# Patient Record
Sex: Female | Born: 1985 | Race: White | Hispanic: No | Marital: Married | Smoking: Never smoker
Health system: Southern US, Community
[De-identification: ages and names within clinical notes are randomized; demographics above are authoritative.]

## PROBLEM LIST (undated history)

## (undated) DIAGNOSIS — O139 Gestational [pregnancy-induced] hypertension without significant proteinuria, unspecified trimester: Secondary | ICD-10-CM

## (undated) DIAGNOSIS — D649 Anemia, unspecified: Secondary | ICD-10-CM

## (undated) DIAGNOSIS — K219 Gastro-esophageal reflux disease without esophagitis: Secondary | ICD-10-CM

## (undated) DIAGNOSIS — F32A Depression, unspecified: Secondary | ICD-10-CM

## (undated) DIAGNOSIS — F419 Anxiety disorder, unspecified: Secondary | ICD-10-CM

## (undated) DIAGNOSIS — F988 Other specified behavioral and emotional disorders with onset usually occurring in childhood and adolescence: Secondary | ICD-10-CM

## (undated) HISTORY — PX: APPENDECTOMY: SHX54

## (undated) HISTORY — DX: Gastro-esophageal reflux disease without esophagitis: K21.9

## (undated) HISTORY — DX: Anemia, unspecified: D64.9

---
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
## 2015-03-07 DIAGNOSIS — F900 Attention-deficit hyperactivity disorder, predominantly inattentive type: Secondary | ICD-10-CM | POA: Insufficient documentation

## 2020-10-19 ENCOUNTER — Ambulatory Visit (HOSPITAL_COMMUNITY)
Admission: EM | Admit: 2020-10-19 | Discharge: 2020-10-19 | Disposition: A | Discharge status: Home / Self Care | Payer: No Typology Code available for payment source | Attending: Medical Oncology | Admitting: Medical Oncology

## 2020-10-19 ENCOUNTER — Encounter (HOSPITAL_COMMUNITY): Payer: Self-pay

## 2020-10-19 ENCOUNTER — Emergency Department (HOSPITAL_COMMUNITY): Payer: No Typology Code available for payment source | Admitting: Anesthesiology

## 2020-10-19 ENCOUNTER — Ambulatory Visit (HOSPITAL_COMMUNITY)
Admission: EM | Admit: 2020-10-19 | Discharge: 2020-10-19 | Disposition: A | Discharge status: Home / Self Care | Payer: No Typology Code available for payment source | Attending: Emergency Medicine | Admitting: Emergency Medicine

## 2020-10-19 ENCOUNTER — Emergency Department (HOSPITAL_COMMUNITY): Payer: No Typology Code available for payment source

## 2020-10-19 ENCOUNTER — Encounter (HOSPITAL_COMMUNITY)
Admission: EM | Disposition: A | Discharge status: Home / Self Care | Payer: Self-pay | Source: Home / Self Care | Attending: Emergency Medicine

## 2020-10-19 ENCOUNTER — Other Ambulatory Visit: Payer: Self-pay

## 2020-10-19 ENCOUNTER — Encounter (HOSPITAL_COMMUNITY): Payer: Self-pay | Admitting: Emergency Medicine

## 2020-10-19 DIAGNOSIS — R1031 Right lower quadrant pain: Secondary | ICD-10-CM

## 2020-10-19 DIAGNOSIS — K3580 Unspecified acute appendicitis: Secondary | ICD-10-CM | POA: Diagnosis not present

## 2020-10-19 DIAGNOSIS — F32A Depression, unspecified: Secondary | ICD-10-CM | POA: Diagnosis not present

## 2020-10-19 DIAGNOSIS — Z79899 Other long term (current) drug therapy: Secondary | ICD-10-CM | POA: Diagnosis not present

## 2020-10-19 DIAGNOSIS — Z8759 Personal history of other complications of pregnancy, childbirth and the puerperium: Secondary | ICD-10-CM | POA: Diagnosis not present

## 2020-10-19 DIAGNOSIS — Z20822 Contact with and (suspected) exposure to covid-19: Secondary | ICD-10-CM | POA: Insufficient documentation

## 2020-10-19 DIAGNOSIS — F419 Anxiety disorder, unspecified: Secondary | ICD-10-CM | POA: Insufficient documentation

## 2020-10-19 DIAGNOSIS — F988 Other specified behavioral and emotional disorders with onset usually occurring in childhood and adolescence: Secondary | ICD-10-CM | POA: Diagnosis not present

## 2020-10-19 DIAGNOSIS — R110 Nausea: Secondary | ICD-10-CM | POA: Diagnosis present

## 2020-10-19 HISTORY — DX: Depression, unspecified: F32.A

## 2020-10-19 HISTORY — PX: LAPAROSCOPIC APPENDECTOMY: SHX408

## 2020-10-19 HISTORY — DX: Other specified behavioral and emotional disorders with onset usually occurring in childhood and adolescence: F98.8

## 2020-10-19 HISTORY — DX: Anxiety disorder, unspecified: F41.9

## 2020-10-19 HISTORY — DX: Gestational (pregnancy-induced) hypertension without significant proteinuria, unspecified trimester: O13.9

## 2020-10-19 LAB — CBC
HCT: 40.1 % (ref 36.0–46.0)
Hemoglobin: 13 g/dL (ref 12.0–15.0)
MCH: 30.3 pg (ref 26.0–34.0)
MCHC: 32.4 g/dL (ref 30.0–36.0)
MCV: 93.5 fL (ref 80.0–100.0)
Platelets: 368 10*3/uL (ref 150–400)
RBC: 4.29 MIL/uL (ref 3.87–5.11)
RDW: 12.6 % (ref 11.5–15.5)
WBC: 15.8 10*3/uL — ABNORMAL HIGH (ref 4.0–10.5)
nRBC: 0 % (ref 0.0–0.2)

## 2020-10-19 LAB — URINALYSIS, ROUTINE W REFLEX MICROSCOPIC
Bacteria, UA: NONE SEEN
Bilirubin Urine: NEGATIVE
Glucose, UA: NEGATIVE mg/dL
Hgb urine dipstick: NEGATIVE
Ketones, ur: NEGATIVE mg/dL
Leukocytes,Ua: NEGATIVE
Nitrite: NEGATIVE
Protein, ur: 30 mg/dL — AB
Specific Gravity, Urine: 1.028 (ref 1.005–1.030)
pH: 7 (ref 5.0–8.0)

## 2020-10-19 LAB — COMPREHENSIVE METABOLIC PANEL
ALT: 26 U/L (ref 0–44)
AST: 24 U/L (ref 15–41)
Albumin: 4.4 g/dL (ref 3.5–5.0)
Alkaline Phosphatase: 91 U/L (ref 38–126)
Anion gap: 12 (ref 5–15)
BUN: 11 mg/dL (ref 6–20)
CO2: 22 mmol/L (ref 22–32)
Calcium: 9.7 mg/dL (ref 8.9–10.3)
Chloride: 103 mmol/L (ref 98–111)
Creatinine, Ser: 0.64 mg/dL (ref 0.44–1.00)
GFR, Estimated: >60 mL/min (ref >60)
Glucose, Bld: 116 mg/dL — ABNORMAL HIGH (ref 70–99)
Potassium: 3.9 mmol/L (ref 3.5–5.1)
Sodium: 137 mmol/L (ref 135–145)
Total Bilirubin: 0.8 mg/dL (ref 0.3–1.2)
Total Protein: 7.9 g/dL (ref 6.5–8.1)

## 2020-10-19 LAB — POC URINE PREG, ED: Preg Test, Ur: NEGATIVE

## 2020-10-19 LAB — POCT URINALYSIS DIPSTICK, ED / UC
Bilirubin Urine: NEGATIVE
Glucose, UA: NEGATIVE mg/dL
Hgb urine dipstick: NEGATIVE
Ketones, ur: NEGATIVE mg/dL
Leukocytes,Ua: NEGATIVE
Nitrite: NEGATIVE
Protein, ur: 30 mg/dL — AB
Specific Gravity, Urine: 1.02 (ref 1.005–1.030)
Urobilinogen, UA: 0.2 mg/dL (ref 0.0–1.0)
pH: 7.5 (ref 5.0–8.0)

## 2020-10-19 LAB — I-STAT BETA HCG BLOOD, ED (MC, WL, AP ONLY): I-stat hCG, quantitative: <5 m[IU]/mL (ref <5)

## 2020-10-19 LAB — SARS CORONAVIRUS 2 BY RT PCR (HOSPITAL ORDER, PERFORMED IN ~~LOC~~ HOSPITAL LAB): SARS Coronavirus 2: NEGATIVE

## 2020-10-19 LAB — LIPASE, BLOOD: Lipase: 23 U/L (ref 11–51)

## 2020-10-19 SURGERY — APPENDECTOMY, LAPAROSCOPIC
Anesthesia: General

## 2020-10-19 MED ORDER — PHENYLEPHRINE 40 MCG/ML (10ML) SYRINGE FOR IV PUSH (FOR BLOOD PRESSURE SUPPORT)
PREFILLED_SYRINGE | INTRAVENOUS | Status: AC
  Filled 2020-10-19: qty 10

## 2020-10-19 MED ORDER — ONDANSETRON 4 MG PO TBDP
4.0000 mg | ORAL_TABLET | Freq: Once | ORAL | Status: AC
  Administered 2020-10-19: 4 mg via ORAL

## 2020-10-19 MED ORDER — DEXMEDETOMIDINE (PRECEDEX) IN NS 20 MCG/5ML (4 MCG/ML) IV SYRINGE
PREFILLED_SYRINGE | INTRAVENOUS | Status: DC | PRN
  Administered 2020-10-19: 4 ug via INTRAVENOUS
  Administered 2020-10-19 (×2): 8 ug via INTRAVENOUS

## 2020-10-19 MED ORDER — CHLORHEXIDINE GLUCONATE 0.12 % MT SOLN
15.0000 mL | OROMUCOSAL | Status: DC
  Filled 2020-10-19: qty 15

## 2020-10-19 MED ORDER — 0.9 % SODIUM CHLORIDE (POUR BTL) OPTIME
TOPICAL | Status: DC | PRN
  Administered 2020-10-19: 1000 mL

## 2020-10-19 MED ORDER — METRONIDAZOLE IN NACL 5-0.79 MG/ML-% IV SOLN
500.0000 mg | Freq: Once | INTRAVENOUS | Status: AC
  Administered 2020-10-19: 500 mg via INTRAVENOUS
  Filled 2020-10-19: qty 100

## 2020-10-19 MED ORDER — MIDAZOLAM HCL 2 MG/2ML IJ SOLN
INTRAMUSCULAR | Status: AC
  Filled 2020-10-19: qty 2

## 2020-10-19 MED ORDER — DEXAMETHASONE SODIUM PHOSPHATE 10 MG/ML IJ SOLN
INTRAMUSCULAR | Status: DC | PRN
  Administered 2020-10-19: 10 mg via INTRAVENOUS

## 2020-10-19 MED ORDER — DEXAMETHASONE SODIUM PHOSPHATE 10 MG/ML IJ SOLN
INTRAMUSCULAR | Status: AC
  Filled 2020-10-19: qty 1

## 2020-10-19 MED ORDER — SUGAMMADEX SODIUM 200 MG/2ML IV SOLN
INTRAVENOUS | Status: DC | PRN
  Administered 2020-10-19: 50 mg via INTRAVENOUS
  Administered 2020-10-19: 150 mg via INTRAVENOUS

## 2020-10-19 MED ORDER — SODIUM CHLORIDE 0.9 % IV BOLUS
1000.0000 mL | Freq: Once | INTRAVENOUS | Status: AC
Start: 2020-10-19 — End: 2020-10-19
  Administered 2020-10-19: 1000 mL via INTRAVENOUS

## 2020-10-19 MED ORDER — ONDANSETRON HCL 4 MG/2ML IJ SOLN
INTRAMUSCULAR | Status: AC
  Filled 2020-10-19: qty 2

## 2020-10-19 MED ORDER — ONDANSETRON 4 MG PO TBDP
ORAL_TABLET | ORAL | Status: AC
  Filled 2020-10-19: qty 1

## 2020-10-19 MED ORDER — SODIUM CHLORIDE 0.9 % IV SOLN
2.0000 g | Freq: Once | INTRAVENOUS | Status: AC
  Administered 2020-10-19: 2 g via INTRAVENOUS
  Filled 2020-10-19: qty 20

## 2020-10-19 MED ORDER — OXYCODONE HCL 5 MG PO TABS: 5.0000 mg | ORAL_TABLET | Freq: Once | ORAL | Status: DC | PRN

## 2020-10-19 MED ORDER — SODIUM CHLORIDE 0.9 % IR SOLN
Status: DC | PRN
  Administered 2020-10-19: 1000 mL

## 2020-10-19 MED ORDER — LIDOCAINE 2% (20 MG/ML) 5 ML SYRINGE
INTRAMUSCULAR | Status: AC
  Filled 2020-10-19: qty 5

## 2020-10-19 MED ORDER — BUPIVACAINE HCL (PF) 0.25 % IJ SOLN
INTRAMUSCULAR | Status: AC
  Filled 2020-10-19: qty 30

## 2020-10-19 MED ORDER — ACETAMINOPHEN 500 MG PO TABS: 1000.0000 mg | ORAL_TABLET | ORAL | Status: DC

## 2020-10-19 MED ORDER — HYDROMORPHONE HCL 1 MG/ML IJ SOLN
0.2500 mg | INTRAMUSCULAR | Status: DC | PRN
  Administered 2020-10-19: 0.5 mg via INTRAVENOUS
  Administered 2020-10-19: 0.25 mg via INTRAVENOUS

## 2020-10-19 MED ORDER — EPHEDRINE 5 MG/ML INJ
INTRAVENOUS | Status: AC
  Filled 2020-10-19: qty 10

## 2020-10-19 MED ORDER — FENTANYL CITRATE (PF) 250 MCG/5ML IJ SOLN
INTRAMUSCULAR | Status: AC
  Filled 2020-10-19: qty 5

## 2020-10-19 MED ORDER — OXYCODONE HCL 5 MG/5ML PO SOLN: 5.0000 mg | Freq: Once | ORAL | Status: DC | PRN

## 2020-10-19 MED ORDER — LIDOCAINE 2% (20 MG/ML) 5 ML SYRINGE
INTRAMUSCULAR | Status: DC | PRN
  Administered 2020-10-19: 40 mg via INTRAVENOUS

## 2020-10-19 MED ORDER — SUCCINYLCHOLINE CHLORIDE 200 MG/10ML IV SOSY
PREFILLED_SYRINGE | INTRAVENOUS | Status: DC | PRN
  Administered 2020-10-19: 140 mg via INTRAVENOUS

## 2020-10-19 MED ORDER — IOHEXOL 300 MG/ML  SOLN
100.0000 mL | Freq: Once | INTRAMUSCULAR | Status: AC | PRN
  Administered 2020-10-19: 100 mL via INTRAVENOUS

## 2020-10-19 MED ORDER — PROPOFOL 10 MG/ML IV BOLUS
INTRAVENOUS | Status: DC | PRN
  Administered 2020-10-19: 170 mg via INTRAVENOUS

## 2020-10-19 MED ORDER — MIDAZOLAM HCL 2 MG/2ML IJ SOLN
INTRAMUSCULAR | Status: DC | PRN
  Administered 2020-10-19: 2 mg via INTRAVENOUS

## 2020-10-19 MED ORDER — FENTANYL CITRATE (PF) 250 MCG/5ML IJ SOLN
INTRAMUSCULAR | Status: DC | PRN
  Administered 2020-10-19 (×2): 50 ug via INTRAVENOUS

## 2020-10-19 MED ORDER — ONDANSETRON HCL 4 MG/2ML IJ SOLN
INTRAMUSCULAR | Status: DC | PRN
  Administered 2020-10-19: 4 mg via INTRAVENOUS

## 2020-10-19 MED ORDER — SUCCINYLCHOLINE CHLORIDE 200 MG/10ML IV SOSY
PREFILLED_SYRINGE | INTRAVENOUS | Status: AC
  Filled 2020-10-19: qty 10

## 2020-10-19 MED ORDER — SODIUM CHLORIDE 0.9 % IV SOLN: Freq: Once | INTRAVENOUS | Status: DC

## 2020-10-19 MED ORDER — GABAPENTIN 300 MG PO CAPS: 300.0000 mg | ORAL_CAPSULE | ORAL | Status: DC

## 2020-10-19 MED ORDER — ROCURONIUM BROMIDE 10 MG/ML (PF) SYRINGE
PREFILLED_SYRINGE | INTRAVENOUS | Status: AC
  Filled 2020-10-19: qty 10

## 2020-10-19 MED ORDER — SCOPOLAMINE 1 MG/3DAYS TD PT72
1.0000 | MEDICATED_PATCH | TRANSDERMAL | Status: DC
  Filled 2020-10-19 (×2): qty 1

## 2020-10-19 MED ORDER — AMISULPRIDE (ANTIEMETIC) 5 MG/2ML IV SOLN: 10.0000 mg | Freq: Once | INTRAVENOUS | Status: DC | PRN

## 2020-10-19 MED ORDER — TRAMADOL HCL 50 MG PO TABS: 50.0000 mg | ORAL_TABLET | Freq: Four times a day (QID) | ORAL | 0 refills | Status: AC | PRN

## 2020-10-19 MED ORDER — PHENYLEPHRINE 40 MCG/ML (10ML) SYRINGE FOR IV PUSH (FOR BLOOD PRESSURE SUPPORT)
PREFILLED_SYRINGE | INTRAVENOUS | Status: DC | PRN
  Administered 2020-10-19: 80 ug via INTRAVENOUS

## 2020-10-19 MED ORDER — KETOROLAC TROMETHAMINE 15 MG/ML IJ SOLN
15.0000 mg | INTRAMUSCULAR | Status: AC
  Administered 2020-10-19: 15 mg via INTRAVENOUS
  Filled 2020-10-19: qty 1

## 2020-10-19 MED ORDER — LACTATED RINGERS IV SOLN: INTRAVENOUS | Status: DC

## 2020-10-19 MED ORDER — ROCURONIUM BROMIDE 10 MG/ML (PF) SYRINGE
PREFILLED_SYRINGE | INTRAVENOUS | Status: DC | PRN
  Administered 2020-10-19: 40 mg via INTRAVENOUS

## 2020-10-19 MED ORDER — MORPHINE SULFATE (PF) 4 MG/ML IV SOLN
4.0000 mg | Freq: Once | INTRAVENOUS | Status: AC
  Administered 2020-10-19: 4 mg via INTRAVENOUS
  Filled 2020-10-19: qty 1

## 2020-10-19 MED ORDER — PROPOFOL 10 MG/ML IV BOLUS
INTRAVENOUS | Status: AC
  Filled 2020-10-19: qty 40

## 2020-10-19 MED ORDER — BUPIVACAINE HCL 0.25 % IJ SOLN
INTRAMUSCULAR | Status: DC | PRN
  Administered 2020-10-19: 14 mL

## 2020-10-19 MED ORDER — HYDROMORPHONE HCL 1 MG/ML IJ SOLN
INTRAMUSCULAR | Status: AC
  Filled 2020-10-19: qty 1

## 2020-10-19 SURGICAL SUPPLY — 51 items
ADH SKN CLS APL DERMABOND .7 (GAUZE/BANDAGES/DRESSINGS) ×1
ADH SKN CLS LQ APL DERMABOND (GAUZE/BANDAGES/DRESSINGS) ×1
APL PRP STRL LF DISP 70% ISPRP (MISCELLANEOUS) ×1
APPLIER CLIP 5 13 M/L LIGAMAX5 (MISCELLANEOUS)
APR CLP MED LRG 5 ANG JAW (MISCELLANEOUS)
BLADE CLIPPER SURG (BLADE) IMPLANT
CANISTER SUCT 3000ML PPV (MISCELLANEOUS) ×2 IMPLANT
CHLORAPREP W/TINT 26 (MISCELLANEOUS) ×2 IMPLANT
CLIP APPLIE 5 13 M/L LIGAMAX5 (MISCELLANEOUS) IMPLANT
CLIP VESOLOCK XL 6/CT (CLIP) ×2 IMPLANT
COVER LIGHT HANDLE  1/PK (MISCELLANEOUS) ×2
COVER LIGHT HANDLE 1/PK (MISCELLANEOUS) ×2 IMPLANT
COVER SURGICAL LIGHT HANDLE (MISCELLANEOUS) ×2 IMPLANT
COVER TRANSDUCER ULTRASND (DRAPES) ×2 IMPLANT
DERMABOND ADHESIVE PROPEN (GAUZE/BANDAGES/DRESSINGS) ×1
DERMABOND ADVANCED (GAUZE/BANDAGES/DRESSINGS) ×1
DERMABOND ADVANCED .7 DNX12 (GAUZE/BANDAGES/DRESSINGS) ×1 IMPLANT
DERMABOND ADVANCED .7 DNX6 (GAUZE/BANDAGES/DRESSINGS) ×1 IMPLANT
ELECT REM PT RETURN 9FT ADLT (ELECTROSURGICAL) ×2
ELECTRODE REM PT RTRN 9FT ADLT (ELECTROSURGICAL) ×1 IMPLANT
ENDOLOOP SUT PDS II  0 18 (SUTURE)
ENDOLOOP SUT PDS II 0 18 (SUTURE) IMPLANT
GLOVE BIO SURGEON STRL SZ7.5 (GLOVE) ×2 IMPLANT
GOWN STRL REUS W/ TWL LRG LVL3 (GOWN DISPOSABLE) ×2 IMPLANT
GOWN STRL REUS W/ TWL XL LVL3 (GOWN DISPOSABLE) ×1 IMPLANT
GOWN STRL REUS W/TWL LRG LVL3 (GOWN DISPOSABLE) ×4
GOWN STRL REUS W/TWL XL LVL3 (GOWN DISPOSABLE) ×2
GRASPER SUT TROCAR 14GX15 (MISCELLANEOUS) ×2 IMPLANT
KIT BASIN OR (CUSTOM PROCEDURE TRAY) ×2 IMPLANT
KIT TURNOVER KIT B (KITS) ×2 IMPLANT
NEEDLE HYPO 25GX1X1/2 BEV (NEEDLE) ×2 IMPLANT
NEEDLE INSUFFLATION 14GA 120MM (NEEDLE) ×2 IMPLANT
NS IRRIG 1000ML POUR BTL (IV SOLUTION) ×2 IMPLANT
PAD ARMBOARD 7.5X6 YLW CONV (MISCELLANEOUS) ×4 IMPLANT
SCISSORS LAP 5X35 DISP (ENDOMECHANICALS) ×2 IMPLANT
SET IRRIG TUBING LAPAROSCOPIC (IRRIGATION / IRRIGATOR) ×2 IMPLANT
SET TUBE SMOKE EVAC HIGH FLOW (TUBING) ×2 IMPLANT
SHEET MEDIUM DRAPE 40X70 STRL (DRAPES) ×2 IMPLANT
SLEEVE ENDOPATH XCEL 5M (ENDOMECHANICALS) ×2 IMPLANT
SPECIMEN JAR SMALL (MISCELLANEOUS) ×2 IMPLANT
SUT MNCRL AB 4-0 PS2 18 (SUTURE) ×2 IMPLANT
SYR CONTROL 10ML LL (SYRINGE) ×2 IMPLANT
TOWEL GREEN STERILE (TOWEL DISPOSABLE) ×2 IMPLANT
TOWEL GREEN STERILE FF (TOWEL DISPOSABLE) ×2 IMPLANT
TRAY FOLEY W/BAG SLVR 16FR (SET/KITS/TRAYS/PACK) ×2
TRAY FOLEY W/BAG SLVR 16FR ST (SET/KITS/TRAYS/PACK) ×1 IMPLANT
TRAY LAPAROSCOPIC MC (CUSTOM PROCEDURE TRAY) ×2 IMPLANT
TROCAR XCEL NON-BLD 11X100MML (ENDOMECHANICALS) ×2 IMPLANT
TROCAR XCEL NON-BLD 5MMX100MML (ENDOMECHANICALS) ×2 IMPLANT
WARMER LAPAROSCOPE (MISCELLANEOUS) ×2 IMPLANT
WATER STERILE IRR 1000ML POUR (IV SOLUTION) ×2 IMPLANT

## 2020-10-19 NOTE — Anesthesia Procedure Notes (Signed)
Procedure Name: Intubation Date/Time: 10/19/2020 7:29 PM Performed by: Darletta Moll, CRNA Pre-anesthesia Checklist: Patient identified, Emergency Drugs available, Suction available and Patient being monitored Patient Re-evaluated:Patient Re-evaluated prior to induction Oxygen Delivery Method: Circle system utilized Preoxygenation: Pre-oxygenation with 100% oxygen Induction Type: IV induction, Rapid sequence and Cricoid Pressure applied Laryngoscope Size: Mac and 3 Grade View: Grade I Tube type: Oral Tube size: 7.0 mm Number of attempts: 1 Airway Equipment and Method: Stylet Placement Confirmation: ETT inserted through vocal cords under direct vision,  positive ETCO2 and breath sounds checked- equal and bilateral Secured at: 21 cm Tube secured with: Tape Dental Injury: Teeth and Oropharynx as per pre-operative assessment

## 2020-10-19 NOTE — ED Notes (Signed)
Patient is being discharged from the Urgent Care and sent to the Emergency Department via [facility] [facility] . Per Provider, patient is in need of higher level of care due to RT lower Quadrant ABD pain and N/V. Patient is aware and verbalizes understanding of plan of care.  Vitals:   10/19/20 0845  BP: (!) 137/92  Pulse: (!) 107  Resp: 16  Temp: 98.5 F (36.9 C)  SpO2: 98%

## 2020-10-19 NOTE — ED Triage Notes (Signed)
Patient c/o ABD pain (RT lower quadrant) x 1 day.   Patient endorses "throwing up every hour while at home".   Patient is "unable to keep food down".   Patient denies fever at home.   Patient denies history of appendicitis.   Patient hasn't taken any medications for symptoms.

## 2020-10-19 NOTE — ED Provider Notes (Signed)
[facility] EMERGENCY DEPARTMENT Provider Note   CSN: [id number] Arrival date & time: 10/19/20  6720     History Chief Complaint  Patient presents with  . Abdominal Pain  . Nausea    Jessica Graham is a 35 y.o. female.  Jessica Graham is a 35 y.o. female with a history of anxiety, depression, ADD, who presents to the ED for evaluation of abdominal pain.  Patient reports that yesterday evening around 7 PM she started having generalized periumbilical abdominal pain, that she describes as a cramping-like sensation.  Around 10:30 PM pain started to worsen and she began having vomiting almost every hour throughout the night, reports that as pain worsened it seemed to settle in the right lower quadrant and since then she has had constant pain in the right lower quadrant that is gotten worse this morning.  Has continued to have nausea but has not been able to eat or drink anything to throw up anymore.  Reports that yesterday she also had some looser stools, denies any melena or hematochezia.  She has had chills but no fevers.  No associated dysuria, urinary frequency, hematuria or flank pain.  No vaginal discharge or vaginal bleeding.  Denies any prior abdominal surgeries.  Reports before she has had some gas pains, but that this pain feels different, initially went to urgent care but was sent here for further evaluation with concern for possible appendicitis.        Past Medical History:  Diagnosis Date  . ADD (attention deficit disorder)   . Anxiety   . Depression   . Gestational hypertension     There are no problems to display for this patient.   Past Surgical History:  Procedure Laterality Date  . TONSILLECTOMY  2008     OB History   No obstetric history on file.     No family history on file.  Social History   Tobacco Use  . Smoking status: Never Smoker  . Smokeless tobacco: Never Used  Substance Use Topics  . Alcohol use: Yes  . Drug use: Never     Home Medications Prior to Admission medications   Medication Sig Start Date End Date Taking? Authorizing Provider  amphetamine-dextroamphetamine (ADDERALL) 20 MG tablet Take 20 mg by mouth 1 day or 1 dose. 09/26/20   [provider]  norethindrone (MICRONOR) 0.35 MG tablet Take 1 tablet by mouth daily. 04/19/20   [provider]  sertraline (ZOLOFT) 100 MG tablet Take 100 mg by mouth 1 day or 1 dose. 08/28/20   [provider]  VYVANSE 60 MG capsule Take 60 mg by mouth every morning. 09/19/20   [provider]    Allergies    Patient has no known allergies.  Review of Systems   Review of Systems  Constitutional: Positive for chills. Negative for fever.  Respiratory: Negative for cough and shortness of breath.   Cardiovascular: Negative for chest pain.  Gastrointestinal: Positive for abdominal pain, diarrhea, nausea and vomiting. Negative for blood in stool.  Genitourinary: Negative for dysuria, flank pain, frequency, hematuria, vaginal bleeding and vaginal discharge.  Musculoskeletal: Negative for arthralgias and myalgias.  Skin: Negative for color change and rash.  Neurological: Negative for dizziness, syncope and light-headedness.  All other systems reviewed and are negative.   Physical Exam Updated Vital Signs BP (!) 136/94 (BP Location: Left Arm)   Pulse (!) 109   Temp 97.9 F (36.6 C) (Oral)   Resp 16   LMP  (  LMP Unknown)   SpO2 97%   Physical Exam Vitals and nursing note reviewed.  Constitutional:      General: She is not in acute distress.    Appearance: She is normal weight and well-nourished. She is not toxic-appearing or diaphoretic.     Comments: Alert, nontoxic-appearing  HENT:     Head: Normocephalic and atraumatic.     Mouth/Throat:     Mouth: Oropharynx is clear and moist.  Eyes:     General:        Right eye: No discharge.        Left eye: No discharge.     Extraocular Movements: EOM normal.     Pupils: Pupils are  equal, round, and reactive to light.  Cardiovascular:     Rate and Rhythm: Regular rhythm. Tachycardia present.     Pulses: Intact distal pulses.     Heart sounds: Normal heart sounds. No murmur heard. No friction rub. No gallop.      Comments: Tachycardia with regular rhythm Pulmonary:     Effort: Pulmonary effort is normal. No respiratory distress.     Breath sounds: Normal breath sounds. No wheezing or rales.     Comments: Respirations equal and unlabored, patient able to speak in full sentences, lungs clear to auscultation bilaterally  Abdominal:     General: Bowel sounds are normal. There is no distension.     Palpations: Abdomen is soft. There is no mass.     Tenderness: There is abdominal tenderness in the right lower quadrant. There is guarding.     Comments: Abdomen is soft, nondistended, bowel sounds present throughout, there is focal tenderness in the right lower quadrant with some guarding, tenderness present at McBurney's point, positive Rovsing sign, all other quadrants nontender.  No CVA tenderness bilaterally.  Musculoskeletal:        General: No deformity or edema.     Cervical back: Neck supple.  Skin:    General: Skin is warm and dry.     Capillary Refill: Capillary refill takes less than 2 seconds.  Neurological:     Mental Status: She is alert and oriented to person, place, and time.     Coordination: Coordination normal.     Comments: Speech is clear, able to follow commands Moves extremities without ataxia, coordination intact  Psychiatric:        Mood and Affect: Mood normal.        Behavior: Behavior normal.     ED Results / Procedures / Treatments   Labs (all labs ordered are listed, but only abnormal results are displayed) Labs Reviewed  COMPREHENSIVE METABOLIC PANEL - Abnormal; Notable for the following components:      Result Value   Glucose, Bld 116 (*)    All other components within normal limits  CBC - Abnormal; Notable for the following  components:   WBC 15.8 (*)    All other components within normal limits  URINALYSIS, ROUTINE W REFLEX MICROSCOPIC - Abnormal; Notable for the following components:   APPearance HAZY (*)    Protein, ur 30 (*)    All other components within normal limits  LIPASE, BLOOD  I-STAT BETA HCG BLOOD, ED ([facility], [facility], [facility] ONLY)    EKG None  Radiology CT Abdomen Pelvis W Contrast  Result Date: 10/19/2020 CLINICAL DATA:  Right lower quadrant pain.  Ongoing for 2 days. EXAM: CT ABDOMEN AND PELVIS WITH CONTRAST TECHNIQUE: Multidetector CT imaging of the abdomen and pelvis was performed using the  standard protocol following bolus administration of intravenous contrast. CONTRAST:  OMNIPAQUE IOHEXOL 300 MG/ML  SOLN COMPARISON:  None FINDINGS: Lower chest: No acute abnormality. Hepatobiliary: No focal liver abnormality is seen. No gallstones, gallbladder wall thickening, or biliary dilatation. Pancreas: Unremarkable. No pancreatic ductal dilatation or surrounding inflammatory changes. Spleen: Normal in size without focal abnormality. Adrenals/Urinary Tract: Adrenal glands are unremarkable. Kidneys are normal, without renal calculi, focal lesion, or hydronephrosis. Bladder is unremarkable. Stomach/Bowel: Stomach is within normal limits. No evidence of bowel wall thickening, distention, or inflammatory changes. Appendix: Location: Retrocecal Diameter: 12 mm.  Periappendiceal inflammatory changes. Appendicolith: No Mucosal hyper-enhancement: Yes Extraluminal gas: No Periappendiceal collection: No Vascular/Lymphatic: No significant vascular findings are present. No enlarged abdominal or pelvic lymph nodes. Reproductive: Uterus and bilateral adnexa are unremarkable. Other: No abdominal wall hernia or abnormality. No abdominopelvic ascites. Musculoskeletal: No acute or significant osseous findings. IMPRESSION: 1. Findings consistent with acute appendicitis. No evidence of perforation or abscess formation. Electronically  Signed   By: Elige Ko   On: 10/19/2020 15:04    Procedures Procedures   Medications Ordered in ED Medications  cefTRIAXone (ROCEPHIN) 2 g in sodium chloride 0.9 % 100 mL IVPB (has no administration in time range)    And  metroNIDAZOLE (FLAGYL) IVPB 500 mg (has no administration in time range)  morphine 4 MG/ML injection 4 mg (has no administration in time range)  0.9 %  sodium chloride infusion (has no administration in time range)  sodium chloride 0.9 % bolus 1,000 mL (1,000 mLs Intravenous New Bag/Given 10/19/20 1318)  morphine 4 MG/ML injection 4 mg (4 mg Intravenous Given 10/19/20 1318)  iohexol (OMNIPAQUE) 300 MG/ML solution 100 mL (100 mLs Intravenous Contrast Given 10/19/20 1448)    ED Course  I have reviewed the triage vital signs and the nursing notes.  Pertinent labs & imaging results that were available during my care of the patient were reviewed by me and considered in my medical decision making (see chart for details).    MDM Rules/Calculators/A&P                         Patient presents to the ED with complaints of abdominal pain. Patient nontoxic appearing, in no apparent distress, on arrival patient is mildly tachycardic, but afebrile and vitals otherwise stable. On exam patient tender to palpation focally in the right lower quadrant with guarding, all other quadrants nontender. Will evaluate with labs and CT abdomen pelvis to assess for possible appendicitis.  Differential also includes diverticulitis, colitis, cholecystitis, ovarian cyst, ovarian torsion, PID, obstruction, perforation.  I have ordered analgesics, anti-emetics, and fluids for symptomatic management  I have independently ordered, reviewed and interpreted all labs and imaging: CBC: Leukocytosis of 15.8, normal hemoglobin CMP: Glucose of 116 but no other electrolyte derangements, normal renal and liver function Lipase: WNL UA: Without evidence of infection, no hematuria Preg test: Negative  CT abdomen  pelvis consistent with acute appendicitis, no evidence of perforation or abscess formation.  Preop Covid swab ordered, patient started on IV Rocephin and Flagyl for preoperative antibiotics and consult placed to general surgery.  Case discussed with general surgery PA Leary Roca, they will see and admit patient and plan for appendectomy.  Final Clinical Impression(s) / ED Diagnoses Final diagnoses:  Acute appendicitis, uncomplicated    Rx / DC Orders ED Discharge Orders    None       Legrand Rams 10/19/20 1529    Adela Lank,  Dan, DO 10/19/20 1544

## 2020-10-19 NOTE — ED Provider Notes (Signed)
[facility]    CSN: [id number] Arrival date & time: 10/19/20  0805      History   Chief Complaint Chief Complaint  Patient presents with  . Abdominal Pain    HPI Jessica Graham is a 35 y.o. female.   HPI   Patient states that since yesterday she has been having right lower quadrant pain. Pain is described as cramp like 8/10. She also has had associated vomiting that started around 11 PM and occurs every few hours.  Vomiting is nonbloody and does not appear to look like coffee grounds.  No constipation or diarrhea.  No known fever.  No history of abdominal surgeries or appendicitis.  She has not taken anything for symptoms and is not able to hold down any foods or liquids.    History reviewed. No pertinent past medical history.  There are no problems to display for this patient.   History reviewed. No pertinent surgical history.  OB History   No obstetric history on file.      Home Medications    Prior to Admission medications   Medication Sig Start Date End Date Taking? Authorizing Provider  amphetamine-dextroamphetamine (ADDERALL) 20 MG tablet Take 20 mg by mouth 1 day or 1 dose. 09/26/20  Yes [provider]  norethindrone (MICRONOR) 0.35 MG tablet Take 1 tablet by mouth daily. 04/19/20  Yes [provider]  sertraline (ZOLOFT) 100 MG tablet Take 100 mg by mouth 1 day or 1 dose. 08/28/20  Yes [provider]  VYVANSE 60 MG capsule Take 60 mg by mouth every morning. 09/19/20  Yes [provider]    Family History No family history on file.  Social History     Allergies   Patient has no known allergies.   Review of Systems Review of Systems  As stated above in HPI Physical Exam Triage Vital Signs ED Triage Vitals [10/19/20 0839]  Enc Vitals Group     BP      Pulse      Resp      Temp      Temp src      SpO2      Weight 148 lb (67.1 kg)     Height 5\' 6"  (1.676 m)     Head Circumference      Peak Flow       Pain Score 7     Pain Loc      Pain Edu?      Excl. in GC?    No data found.  Updated Vital Signs Ht 5\' 6"  (1.676 m)   Wt 148 lb (67.1 kg)   BMI 23.89 kg/m   Physical Exam Vitals and nursing note reviewed.  Constitutional:      General: She is not in acute distress.    Appearance: She is ill-appearing. She is not toxic-appearing or diaphoretic.  Cardiovascular:     Rate and Rhythm: Normal rate and regular rhythm.     Heart sounds: Normal heart sounds.  Pulmonary:     Effort: Pulmonary effort is normal.     Breath sounds: Normal breath sounds.  Abdominal:     General: Abdomen is flat. Bowel sounds are normal. There is no distension.     Palpations: There is no hepatomegaly or splenomegaly.     Tenderness: There is abdominal tenderness in the right lower quadrant. There is guarding. There is no right CVA tenderness or left CVA tenderness. Positive signs include Murphy's sign. Negative signs include  McBurney's sign and psoas sign.     Hernia: No hernia is present.  Skin:    General: Skin is warm and dry.  Neurological:     Mental Status: She is alert.    UC Treatments / Results  Labs (all labs ordered are listed, but only abnormal results are displayed) Labs Reviewed - No data to display  EKG   Radiology No results found.  Procedures Procedures (including critical care time)  Medications Ordered in UC Medications  ondansetron (ZOFRAN-ODT) disintegrating tablet 4 mg (has no administration in time range)    Initial Impression / Assessment and Plan / UC Course  I have reviewed the triage vital signs and the nursing notes.  Pertinent labs & imaging results that were available during my care of the patient were reviewed by me and considered in my medical decision making (see chart for details).     New. UA and HCG pending here in office to help with efficiency of work up. Given extent of pain, examination findings and inability to tolerate foods and liquids I  have referred her to the ER for further evaluation and work up. She requests Zofran in the meantime to help reduce her nausea. Discussed NPO other than her oral disintegrating tablet.   Final Clinical Impressions(s) / UC Diagnoses   Final diagnoses:  None   Discharge Instructions   None    ED Prescriptions    None     PDMP not reviewed this encounter.   Rushie Chestnut, [place] 10/19/20 1043

## 2020-10-19 NOTE — Anesthesia Postprocedure Evaluation (Signed)
Anesthesia Post Note  Patient: Jessica Graham  Procedure(s) Performed: APPENDECTOMY LAPAROSCOPIC (N/A )     Patient location during evaluation: PACU Anesthesia Type: General Level of consciousness: awake and alert Pain management: pain level controlled Vital Signs Assessment: post-procedure vital signs reviewed and stable Respiratory status: spontaneous breathing, nonlabored ventilation, respiratory function stable and patient connected to nasal cannula oxygen Cardiovascular status: blood pressure returned to baseline and stable Postop Assessment: no apparent nausea or vomiting Anesthetic complications: no   No complications documented.  Last Vitals:  Vitals:   10/19/20 1800 10/19/20 2010  BP: 133/84   Pulse: (!) 110   Resp: 18   Temp: 37.6 C 36.6 C  SpO2: 98%     Last Pain:  Vitals:   10/19/20 2010  TempSrc:   PainSc: 0-No pain                 Jessica Graham

## 2020-10-19 NOTE — ED Triage Notes (Signed)
Pt c.o RLQ pain and nausea since last night, sent by UC for further evaluation and to rule out appendicitis. Pt given zofran at [facility] with relief of nausea

## 2020-10-19 NOTE — Op Note (Signed)
10/19/2020  7:54 PM  PATIENT:  Jessica Graham  35 y.o. female  PRE-OPERATIVE DIAGNOSIS:  ACUTE APPENDICITIS  POST-OPERATIVE DIAGNOSIS:  ACUTE APPENDICITIS, non perforated  PROCEDURE:  Procedure(s): APPENDECTOMY LAPAROSCOPIC (N/A)  SURGEON:  Surgeon(s) and Role:    Axel Filler, MD - Primary  ANESTHESIA:   local and general  EBL:  minimal   BLOOD ADMINISTERED:none  DRAINS: none   LOCAL MEDICATIONS USED:  BUPIVICAINE   SPECIMEN:  Source of Specimen:  appendix  DISPOSITION OF SPECIMEN:  PATHOLOGY  COUNTS:  YES  TOURNIQUET:  * No tourniquets in log *  DICTATION: .Dragon Dictation Complications: none  Counts: reported as correct x 2  Findings:  The patient had a acutely inflamed, non perforated appendix  Specimen: Appendix  Indications for procedure:  The patient is a 35 year old female with a history of periumbilical pain localized in the right lower quadrant patient had a CT scan which revealed signs consistent with acute appendicitis the patient back in for laparoscopic appendectomy.  Details of the procedure:The patient was taken back to the operating room. The patient was placed in supine position with bilateral SCDs in place.  The patient was prepped and draped in the usual sterile fashion.  After appropriate anitbiotics were confirmed, a time-out was confirmed and all facts were verified.    A pneumoperitoneum of 14 mmHg was obtained via a Veress needle technique in the left lower quadrant quadrant.  A 5 mm trocar and 5 mm camera then placed intra-abdominally there is no injury to any intra-abdominal organs a 10 mm infraumbilical port was placed and direct visualization as was a 5 mm port in the suprapubic area.   The appendix was identified and seen to be non-perforated.  The appendix was cleaned down to the appendiceal base. The mesoappendix was then incised and the appendiceal artery was cauterized.  The the appendiceal base was clean.  A gold hemoclip was  placed proximallyx2 and one distally and the appendix was transected between these 2. A retrieval bag was then placed into the abdomen and the specimen placed in the bag. The appendiceal stump was cauterized. We evacuate the fluid from the pelvis until the effluent was clear.  The appendix and retrieval  bag was then retrieved via the supraumbilical port. #1 Vicryl was used to reapproximate the fascia at the umbilical port site x2. The skin was reapproximated all port sites 3-0 Monocryl subcuticular fashion. The skin was dressed with Dermabond.  The patient had the foley removed. The patient was awakened from general anesthesia was taken to recovery room in stable condition.      PLAN OF CARE: Discharge to home after PACU  PATIENT DISPOSITION:  PACU - hemodynamically stable.   Delay start of Pharmacological VTE agent (>24hrs) due to surgical blood loss or risk of bleeding: not applicable

## 2020-10-19 NOTE — Discharge Instructions (Addendum)
Nothing by mouth until given the ok in the emergency room

## 2020-10-19 NOTE — Transfer of Care (Signed)
Immediate Anesthesia Transfer of Care Note  Patient: Jessica Graham  Procedure(s) Performed: APPENDECTOMY LAPAROSCOPIC (N/A )  Patient Location: PACU  Anesthesia Type:General  Level of Consciousness: drowsy and patient cooperative  Airway & Oxygen Therapy: Patient Spontanous Breathing  Post-op Assessment: Report given to RN, Post -op Vital signs reviewed and stable and Patient moving all extremities X 4  Post vital signs: Reviewed and stable  Last Vitals:  Vitals Value Taken Time  BP 116/71 10/19/20 2008  Temp    Pulse 128 10/19/20 2009  Resp 22 10/19/20 2009  SpO2 92 % 10/19/20 2009  Vitals shown include unvalidated device data.  Last Pain:  Vitals:   10/19/20 1800  TempSrc: Oral  PainSc:          Complications: No complications documented.

## 2020-10-19 NOTE — Discharge Instructions (Signed)
[facility] LAPAROSCOPIC SURGERY: POST OP INSTRUCTIONS Always review your discharge instruction sheet given to you by the facility where your surgery was performed. IF YOU HAVE DISABILITY OR FAMILY LEAVE FORMS, YOU MUST BRING THEM TO THE OFFICE FOR PROCESSING.   DO NOT GIVE THEM TO YOUR DOCTOR.  PAIN CONTROL  1. First take acetaminophen (Tylenol) AND/or ibuprofen (Advil) to control your pain after surgery.  Follow directions on package.  Taking acetaminophen (Tylenol) and/or ibuprofen (Advil) regularly after surgery will help to control your pain and lower the amount of prescription pain medication you may need.  You should not take more than 3,000 mg (3 grams) of acetaminophen (Tylenol) in 24 hours.  You should not take ibuprofen (Advil), aleve, motrin, naprosyn or other NSAIDS if you have a history of stomach ulcers or chronic kidney disease.  2. A prescription for pain medication may be given to you upon discharge.  Take your pain medication as prescribed, if you still have uncontrolled pain after taking acetaminophen (Tylenol) or ibuprofen (Advil). 3. Use ice packs to help control pain. 4. If you need a refill on your pain medication, please contact your pharmacy.  They will contact our office to request authorization. Prescriptions will not be filled after 5pm or on week-ends.  HOME MEDICATIONS 5. Take your usually prescribed medications unless otherwise directed.  DIET 6. You should follow a light diet the first few days after arrival home.  Be sure to include lots of fluids daily. Avoid fatty, fried foods.   CONSTIPATION 7. It is common to experience some constipation after surgery and if you are taking pain medication.  Increasing fluid intake and taking a stool softener (such as Colace) will usually help or prevent this problem from occurring.  A mild laxative (Milk of Magnesia or Miralax) should be taken according to package instructions if there are no bowel  movements after 48 hours.  WOUND/INCISION CARE 8. Most patients will experience some swelling and bruising in the area of the incisions.  Ice packs will help.  Swelling and bruising can take several days to resolve.  9. Unless discharge instructions indicate otherwise, follow guidelines below  a. STERI-STRIPS - you may remove your outer bandages 48 hours after surgery, and you may shower at that time.  You have steri-strips (small skin tapes) in place directly over the incision.  These strips should be left on the skin for 7-10 days.   b. DERMABOND/SKIN GLUE - you may shower in 24 hours.  The glue will flake off over the next 2-3 weeks. 10. Any sutures or staples will be removed at the office during your follow-up visit.  ACTIVITIES 11. You may resume regular (light) daily activities beginning the next day--such as daily self-care, walking, climbing stairs--gradually increasing activities as tolerated.  You may have sexual intercourse when it is comfortable.  Refrain from any heavy lifting or straining until approved by your doctor. a. You may drive when you are no longer taking prescription pain medication, you can comfortably wear a seatbelt, and you can safely maneuver your car and apply brakes.  FOLLOW-UP 12. You should see your doctor in the office for a follow-up appointment approximately 2-3 weeks after your surgery.  You should have been given your post-op/follow-up appointment when your surgery was scheduled.  If you did not receive a post-op/follow-up appointment, make sure that you call for this appointment within a day or two after you arrive home to insure a convenient appointment time.     WHEN TO CALL YOUR DOCTOR: 1. Fever over 101.0 2. Inability to urinate 3. Continued bleeding from incision. 4. Increased pain, redness, or drainage from the incision. 5. Increasing abdominal pain  The clinic staff is available to answer your questions during regular business hours.  Please don't  hesitate to call and ask to speak to one of the nurses for clinical concerns.  If you have a medical emergency, go to the nearest emergency room or call 911.  A surgeon from [facility] is always on call at the hospital. [address], [address], [place], [place]  [address] ? [address] [address], , [place]   [address] [phone] ? [phone] ? FAX [phone] Web site: [email or web address]  .........   Managing Your Pain After Surgery Without Opioids    Thank you for participating in our program to help patients manage their pain after surgery without opioids. This is part of our effort to provide you with the best care possible, without exposing you or your family to the risk that opioids pose.  What pain can I expect after surgery? You can expect to have some pain after surgery. This is normal. The pain is typically worse the day after surgery, and quickly begins to get better. Many studies have found that many patients are able to manage their pain after surgery with Over-the-Counter (OTC) medications such as Tylenol and Motrin. If you have a condition that does not allow you to take Tylenol or Motrin, notify your surgical team.  How will I manage my pain? The best strategy for controlling your pain after surgery is around the clock pain control with Tylenol (acetaminophen) and Motrin (ibuprofen or Advil). Alternating these medications with each other allows you to maximize your pain control. In addition to Tylenol and Motrin, you can use heating pads or ice packs on your incisions to help reduce your pain.  How will I alternate your regular strength over-the-counter pain medication? You will take a dose of pain medication every three hours. ; Start by taking 650 mg of Tylenol (2 pills of 325 mg) ; 3 hours later take 600 mg of Motrin (3 pills of 200 mg) ; 3 hours after taking the Motrin take 650 mg of Tylenol ; 3 hours after that take 600 mg of  Motrin.   - 1 -  See example - if your first dose of Tylenol is at 12:00 PM   12:00 PM Tylenol 650 mg (2 pills of 325 mg)  3:00 PM Motrin 600 mg (3 pills of 200 mg)  6:00 PM Tylenol 650 mg (2 pills of 325 mg)  9:00 PM Motrin 600 mg (3 pills of 200 mg)  Continue alternating every 3 hours   We recommend that you follow this schedule around-the-clock for at least 3 days after surgery, or until you feel that it is no longer needed. Use the table on the last page of this handout to keep track of the medications you are taking. Important: Do not take more than 3000mg of Tylenol or 3200mg of Motrin in a 24-hour period. Do not take ibuprofen/Motrin if you have a history of bleeding stomach ulcers, severe kidney disease, &/or actively taking a blood thinner  What if I still have pain? If you have pain that is not controlled with the over-the-counter pain medications (Tylenol and Motrin or Advil) you might have what we call "breakthrough" pain. You will receive a prescription for a small amount of an opioid pain medication such as   Oxycodone, Tramadol, or Tylenol with Codeine. Use these opioid pills in the first 24 hours after surgery if you have breakthrough pain. Do not take more than 1 pill every 4-6 hours.  If you still have uncontrolled pain after using all opioid pills, don't hesitate to call our staff using the number provided. We will help make sure you are managing your pain in the best way possible, and if necessary, we can provide a prescription for additional pain medication.   Day 1    Time  Name of Medication Number of pills taken  Amount of Acetaminophen  Pain Level   Comments  AM PM       AM PM       AM PM       AM PM       AM PM       AM PM       AM PM       AM PM       Total Daily amount of Acetaminophen Do not take more than  3,000 mg per day      Day 2    Time  Name of Medication Number of pills taken  Amount of Acetaminophen  Pain Level   Comments  AM  PM       AM PM       AM PM       AM PM       AM PM       AM PM       AM PM       AM PM       Total Daily amount of Acetaminophen Do not take more than  3,000 mg per day      Day 3    Time  Name of Medication Number of pills taken  Amount of Acetaminophen  Pain Level   Comments  AM PM       AM PM       AM PM       AM PM          AM PM       AM PM       AM PM       AM PM       Total Daily amount of Acetaminophen Do not take more than  3,000 mg per day      Day 4    Time  Name of Medication Number of pills taken  Amount of Acetaminophen  Pain Level   Comments  AM PM       AM PM       AM PM       AM PM       AM PM       AM PM       AM PM       AM PM       Total Daily amount of Acetaminophen Do not take more than  3,000 mg per day      Day 5    Time  Name of Medication Number of pills taken  Amount of Acetaminophen  Pain Level   Comments  AM PM       AM PM       AM PM       AM PM       AM PM       AM PM       AM PM         AM PM       Total Daily amount of Acetaminophen Do not take more than  3,000 mg per day       Day 6    Time  Name of Medication Number of pills taken  Amount of Acetaminophen  Pain Level  Comments  AM PM       AM PM       AM PM       AM PM       AM PM       AM PM       AM PM       AM PM       Total Daily amount of Acetaminophen Do not take more than  3,000 mg per day      Day 7    Time  Name of Medication Number of pills taken  Amount of Acetaminophen  Pain Level   Comments  AM PM       AM PM       AM PM       AM PM       AM PM       AM PM       AM PM       AM PM       Total Daily amount of Acetaminophen Do not take more than  3,000 mg per day        For additional information about how and where to safely dispose of unused opioid medications - https://www.morepowerfulnc.org  Disclaimer: This document contains information and/or instructional materials adapted from [facility]  for the typical patient with your condition. It does not replace medical advice from your health care provider because your experience may differ from that of the typical patient. Talk to your health care provider if you have any questions about this document, your condition or your treatment plan. Adapted from [facility]  

## 2020-10-19 NOTE — H&P (Signed)
Admission Note  Jessica Graham 05/25/1986  [id number].    Requesting MD: Jodi Geralds PA-C Chief Complaint/Reason for Consult: Acute appendicitis   HPI:  Patient is a 35 year old female who presented to [facility] with abdominal pain. Pain started yesterday around 7 PM. Started periumbilically and was cramping in nature. Pain has progressively worsened and now localized to RLQ. Associated nausea and vomiting and diarrhea. Chills reported but no fever. No urinary symptoms. Initially went to urgent care but was referred to ED due to concern for acute appendicitis. PMH significant for depression, anxiety, ADD, and gestational HTN. NKDA. No blood thinning medications. No past abdominal surgery.   ROS: Review of Systems  Constitutional: Positive for chills. Negative for fever.  Respiratory: Negative for shortness of breath and wheezing.   Cardiovascular: Negative for chest pain and palpitations.  Gastrointestinal: Positive for abdominal pain, diarrhea, nausea and vomiting. Negative for blood in stool, constipation and melena.  Genitourinary: Negative for dysuria, frequency and urgency.  Psychiatric/Behavioral: Negative for substance abuse.  All other systems reviewed and are negative.   No family history on file.  Past Medical History:  Diagnosis Date  . ADD (attention deficit disorder)   . Anxiety   . Depression   . Gestational hypertension     Past Surgical History:  Procedure Laterality Date  . TONSILLECTOMY  2008    Social History:  reports that she has never smoked. She has never used smokeless tobacco. She reports current alcohol use. She reports that she does not use drugs. Patient is self employed, Environmental manager Lives at home with her husband who is a nephrologist who works here at [employer] 3-4 alcohol beverages 3 times per week.  No tobacco or illicit drug use   Allergies: No Known Allergies  (Not in a hospital admission)   Blood pressure (!) 136/102, pulse 94,  temperature 98.7 F (37.1 C), temperature source Oral, resp. rate 18, SpO2 98 %. Physical Exam:  General: pleasant, WD,  female who is laying in bed in NAD HEENT: head is normocephalic, atraumatic.  Sclera are noninjected.  PERRL.  Ears and nose without any masses or lesions.  Mouth is pink and moist Heart: regular, rate, and rhythm.  Normal s1,s2. No obvious murmurs, gallops, or rubs noted.  Palpable radial pulses bilaterally Lungs: CTAB, no wheezes, rhonchi, or rales noted.  Respiratory effort nonlabored Abd: Soft, ttp in RLQ, ND, +BS, no masses, hernias, or organomegaly, no prior abdominal scars.  MS: all 4 extremities are symmetrical with no cyanosis, clubbing, or edema. Skin: warm and dry with no masses, lesions, or rashes Neuro: Cranial nerves 2-12 grossly intact, sensation is normal throughout, moves all extremities, gait not assessed Psych: A&Ox4 with an appropriate affect.  Results for orders placed or performed during the hospital encounter of 10/19/20 (from the past 48 hour(s))  Lipase, blood     Status: None   Collection Time: 10/19/20  9:57 AM  Result Value Ref Range   Lipase 23 11 - 51 U/L    Comment: Performed at [facility], [address] [place], [place] [address]  Comprehensive metabolic panel     Status: Abnormal   Collection Time: 10/19/20  9:57 AM  Result Value Ref Range   Sodium 137 135 - 145 mmol/L   Potassium 3.9 3.5 - 5.1 mmol/L   Chloride 103 98 - 111 mmol/L   CO2 22 22 - 32 mmol/L   Glucose, Bld 116 (H) 70 - 99 mg/dL  Comment: Glucose reference range applies only to samples taken after fasting for at least 8 hours.   BUN 11 6 - 20 mg/dL   Creatinine, Ser 1.28 0.44 - 1.00 mg/dL   Calcium 9.7 8.9 - 78.6 mg/dL   Total Protein 7.9 6.5 - 8.1 g/dL   Albumin 4.4 3.5 - 5.0 g/dL   AST 24 15 - 41 U/L   ALT 26 0 - 44 U/L   Alkaline Phosphatase 91 38 - 126 U/L   Total Bilirubin 0.8 0.3 - 1.2 mg/dL   GFR, Estimated >76 >72 mL/min    Comment:  (NOTE) Calculated using the CKD-EPI Creatinine Equation (2021)    Anion gap 12 5 - 15    Comment: Performed at [facility], [address] [place], [place] [address]  CBC     Status: Abnormal   Collection Time: 10/19/20  9:57 AM  Result Value Ref Range   WBC 15.8 (H) 4.0 - 10.5 K/uL   RBC 4.29 3.87 - 5.11 MIL/uL   Hemoglobin 13.0 12.0 - 15.0 g/dL   HCT 96.2 83.6 - 62.9 %   MCV 93.5 80.0 - 100.0 fL   MCH 30.3 26.0 - 34.0 pg   MCHC 32.4 30.0 - 36.0 g/dL   RDW 47.6 54.6 - 50.3 %   Platelets 368 150 - 400 K/uL   nRBC 0.0 0.0 - 0.2 %    Comment: Performed at [facility], [address] [place], [place] [address]  I-Stat beta hCG blood, ED     Status: None   Collection Time: 10/19/20 10:10 AM  Result Value Ref Range   I-stat hCG, quantitative <5.0 <5 mIU/mL   Comment 3            Comment:   GEST. AGE      CONC.  (mIU/mL)   <=1 WEEK        5 - 50     2 WEEKS       50 - 500     3 WEEKS       100 - 10,000     4 WEEKS     1,000 - 30,000        FEMALE AND NON-PREGNANT FEMALE:     LESS THAN 5 mIU/mL   Urinalysis, Routine w reflex microscopic     Status: Abnormal   Collection Time: 10/19/20 10:30 AM  Result Value Ref Range   Color, Urine YELLOW YELLOW   APPearance HAZY (A) CLEAR   Specific Gravity, Urine 1.028 1.005 - 1.030   pH 7.0 5.0 - 8.0   Glucose, UA NEGATIVE NEGATIVE mg/dL   Hgb urine dipstick NEGATIVE NEGATIVE   Bilirubin Urine NEGATIVE NEGATIVE   Ketones, ur NEGATIVE NEGATIVE mg/dL   Protein, ur 30 (A) NEGATIVE mg/dL   Nitrite NEGATIVE NEGATIVE   Leukocytes,Ua NEGATIVE NEGATIVE   RBC / HPF 0-5 0 - 5 RBC/hpf   WBC, UA 0-5 0 - 5 WBC/hpf   Bacteria, UA NONE SEEN NONE SEEN   Squamous Epithelial / LPF 0-5 0 - 5   Mucus PRESENT     Comment: Performed at [facility], [address] [place], [place] [address]   CT Abdomen Pelvis W Contrast  Result Date: 10/19/2020 CLINICAL DATA:  Right lower quadrant pain.  Ongoing for 2 days. EXAM: CT ABDOMEN AND  PELVIS WITH CONTRAST TECHNIQUE: Multidetector CT imaging of the abdomen and pelvis was performed using the standard protocol following bolus administration of  intravenous contrast. CONTRAST:  OMNIPAQUE IOHEXOL 300 MG/ML  SOLN COMPARISON:  None FINDINGS: Lower chest: No acute abnormality. Hepatobiliary: No focal liver abnormality is seen. No gallstones, gallbladder wall thickening, or biliary dilatation. Pancreas: Unremarkable. No pancreatic ductal dilatation or surrounding inflammatory changes. Spleen: Normal in size without focal abnormality. Adrenals/Urinary Tract: Adrenal glands are unremarkable. Kidneys are normal, without renal calculi, focal lesion, or hydronephrosis. Bladder is unremarkable. Stomach/Bowel: Stomach is within normal limits. No evidence of bowel wall thickening, distention, or inflammatory changes. Appendix: Location: Retrocecal Diameter: 12 mm.  Periappendiceal inflammatory changes. Appendicolith: No Mucosal hyper-enhancement: Yes Extraluminal gas: No Periappendiceal collection: No Vascular/Lymphatic: No significant vascular findings are present. No enlarged abdominal or pelvic lymph nodes. Reproductive: Uterus and bilateral adnexa are unremarkable. Other: No abdominal wall hernia or abnormality. No abdominopelvic ascites. Musculoskeletal: No acute or significant osseous findings. IMPRESSION: 1. Findings consistent with acute appendicitis. No evidence of perforation or abscess formation. Electronically Signed   By: Elige Ko   On: 10/19/2020 15:04    Anti-infectives (From admission, onward)   Start     Dose/Rate Route Frequency Ordered Stop   10/19/20 1515  cefTRIAXone (ROCEPHIN) 2 g in sodium chloride 0.9 % 100 mL IVPB       "And" Linked Group Details   2 g 200 mL/hr over 30 Minutes Intravenous  Once 10/19/20 1508     10/19/20 1515  metroNIDAZOLE (FLAGYL) IVPB 500 mg       "And" Linked Group Details   500 mg 100 mL/hr over 60 Minutes Intravenous  Once 10/19/20 1508          Assessment/Plan Acute Appendicitis - CT today with retrocecal appendix and surrounding inflammatory changes, no appendicolith - WBC 15.8, afebrile  - COVID test pending - provided that COVID test is negative, will plan to proceed to OR for laparoscopic appendectomy. Dr. Derrell Lolling has discussed the procedure with the patient and her husband.   FEN: NPO, IVF VTE: SCDs ID: rocephin/flagyl ordered    May be able to discharge from PACU post-operatively, if unable to discharge then patient will be admitted to observation.   Leary Roca, [facility] 10/19/2020, 3:21 PM Please see Amion for pager number during day hours 7:00am-4:30pm

## 2020-10-19 NOTE — Anesthesia Preprocedure Evaluation (Addendum)
Anesthesia Evaluation  Patient identified by MRN, date of birth, ID band Patient awake    Reviewed: Allergy & Precautions, NPO status , Patient's Chart, lab work & pertinent test results, Unable to perform ROS - Chart review only  Airway Mallampati: II  TM Distance: >3 FB Neck ROM: Full    Dental  (+) Teeth Intact, Dental Advisory Given   Pulmonary neg pulmonary ROS,    breath sounds clear to auscultation       Cardiovascular hypertension, Pt. on medications  Rhythm:Regular Rate:Tachycardia     Neuro/Psych PSYCHIATRIC DISORDERS Anxiety Depression negative neurological ROS     GI/Hepatic negative GI ROS, Neg liver ROS,   Endo/Other  negative endocrine ROS  Renal/GU negative Renal ROS     Musculoskeletal negative musculoskeletal ROS (+)   Abdominal   Peds  Hematology negative hematology ROS (+)   Anesthesia Other Findings   Reproductive/Obstetrics                            Anesthesia Physical Anesthesia Plan  ASA: II and emergent  Anesthesia Plan: General   Post-op Pain Management:    Induction: Intravenous, Cricoid pressure planned and Rapid sequence  PONV Risk Score and Plan: 4 or greater and Ondansetron, Dexamethasone, Propofol infusion, Midazolam, Scopolamine patch - Pre-op, Treatment may vary due to age or medical condition and Diphenhydramine  Airway Management Planned: Oral ETT  Additional Equipment: None  Intra-op Plan:   Post-operative Plan: Extubation in OR  Informed Consent: I have reviewed the patients History and Physical, chart, labs and discussed the procedure including the risks, benefits and alternatives for the proposed anesthesia with the patient or authorized representative who has indicated his/her understanding and acceptance.     Dental advisory given  Plan Discussed with: CRNA and Anesthesiologist  Anesthesia Plan Comments:        Anesthesia  Quick Evaluation

## 2020-10-20 ENCOUNTER — Encounter (HOSPITAL_COMMUNITY): Payer: Self-pay | Admitting: General Surgery

## 2020-10-23 LAB — SURGICAL PATHOLOGY

## 2020-10-29 ENCOUNTER — Encounter (HOSPITAL_COMMUNITY): Payer: Self-pay | Admitting: General Surgery

## 2021-07-10 IMAGING — CT CT ABD-PELV W/ CM
2 of 4 series · 16 of 46 positions shown, 18 images · IV contrast (Omni 300)
Comparison: None

CLINICAL DATA: Right lower quadrant pain.  Ongoing for 2 days.

EXAM:
CT ABDOMEN AND PELVIS WITH CONTRAST
TECHNIQUE: Multidetector CT imaging of the abdomen and pelvis was performed
using the standard protocol following bolus administration of
intravenous contrast.
CONTRAST:  100mL OMNIPAQUE IOHEXOL 300 MG/ML  SOLN

[Series 3: a/p w/ 5mm · axial · 0.73mm/px · z∈[-567,-162]mm · 13 of 89 slices shown, 15 images]
[im 4/89  soft-tissue]
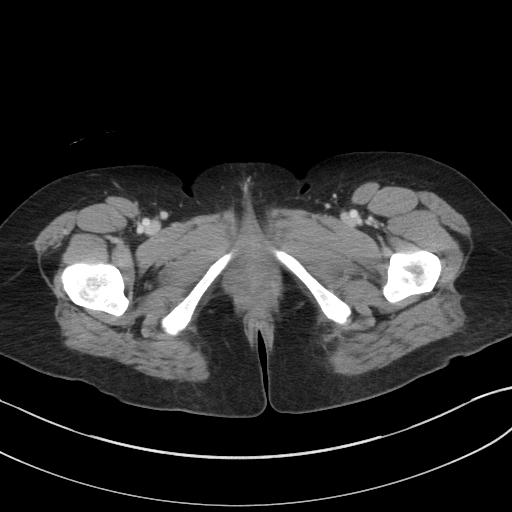
[im 4/89  bone]
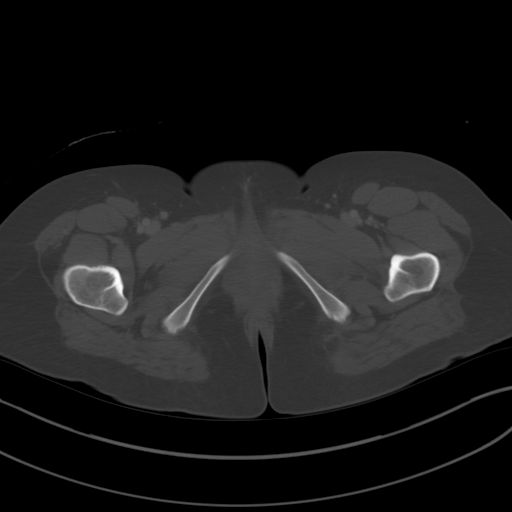
[im 12/89  soft-tissue]
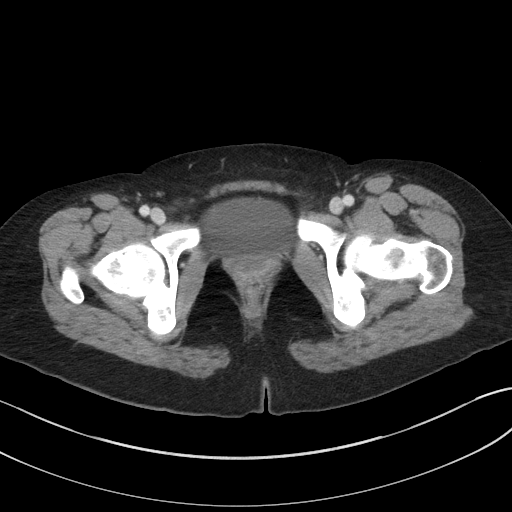
[im 19/89  soft-tissue]
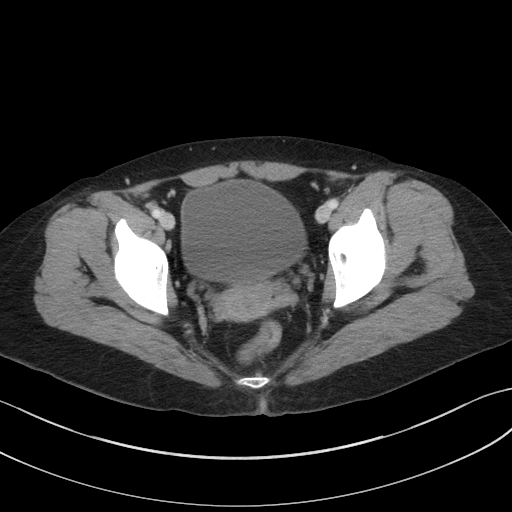
[im 26/89  soft-tissue]
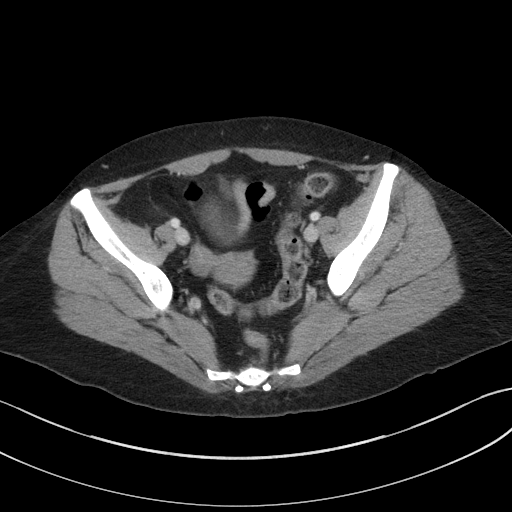
[im 30/89  soft-tissue]
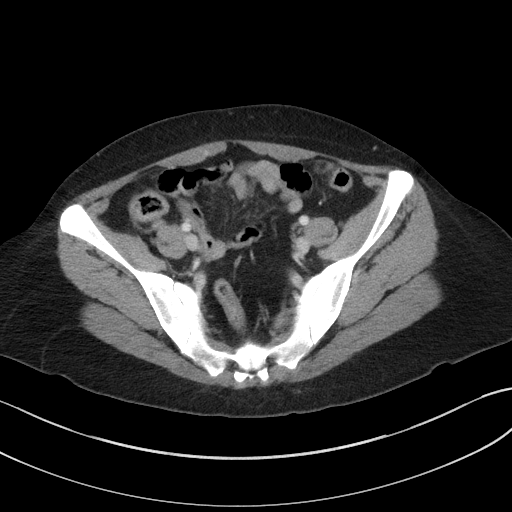
[im 37/89  soft-tissue]
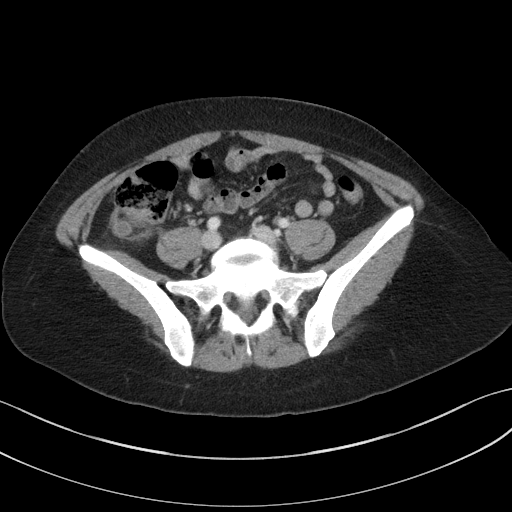
[im 45/89  soft-tissue]
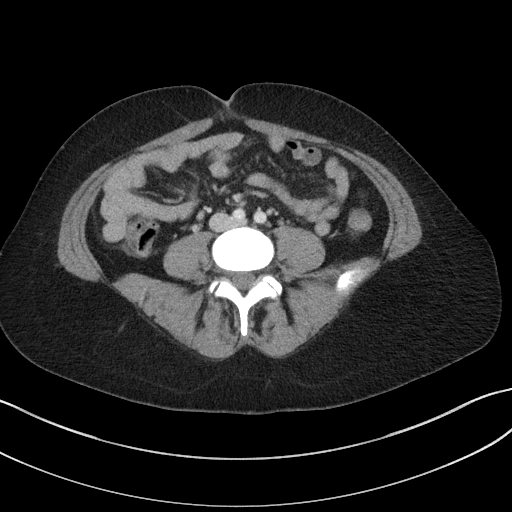
[im 52/89  soft-tissue]
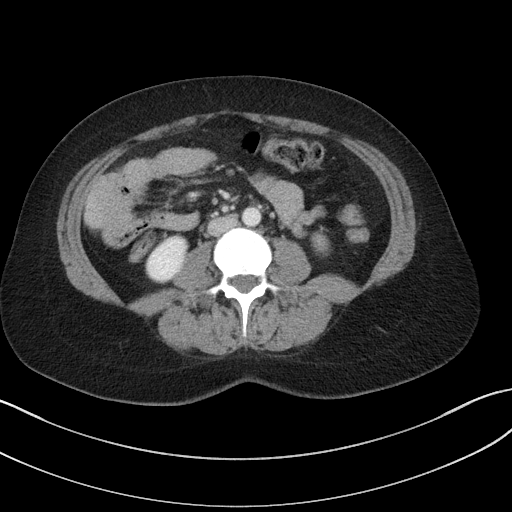
[im 59/89  soft-tissue]
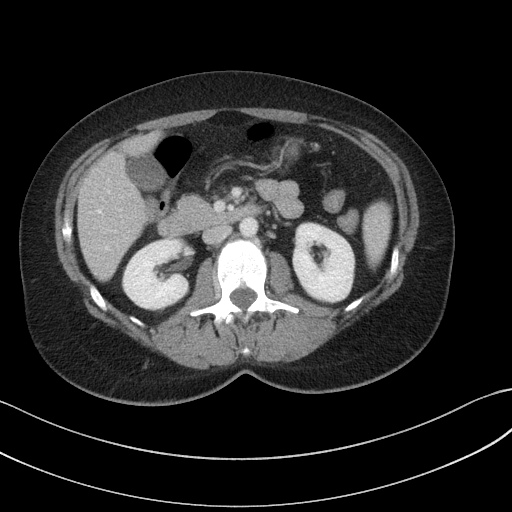
[im 59/89  bone]
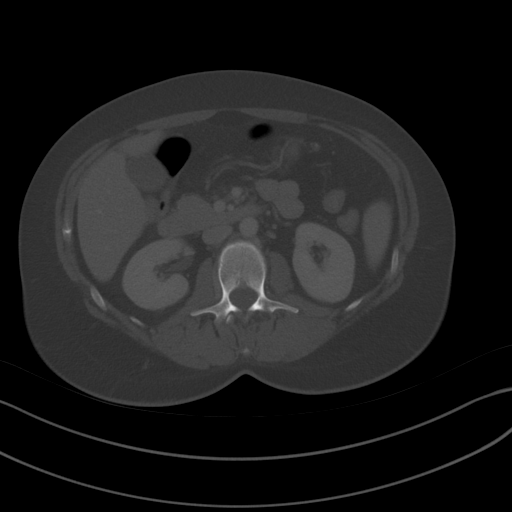
[im 63/89  soft-tissue]
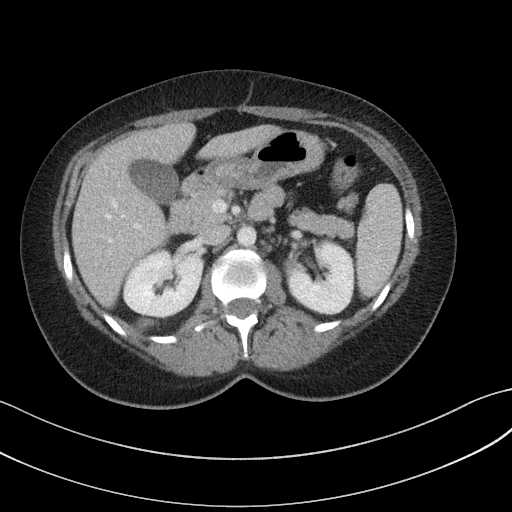
[im 70/89  soft-tissue]
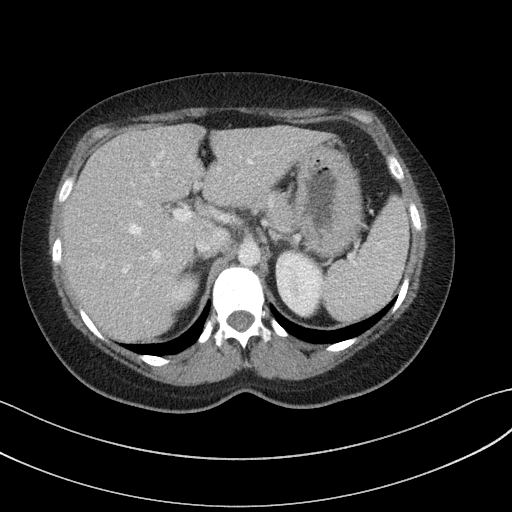
[im 78/89  soft-tissue]
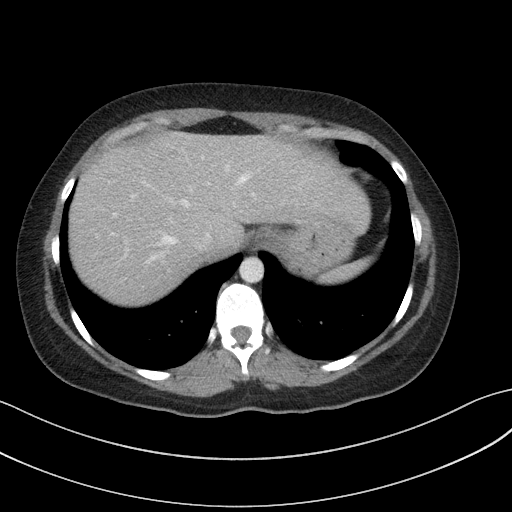
[im 85/89  soft-tissue]
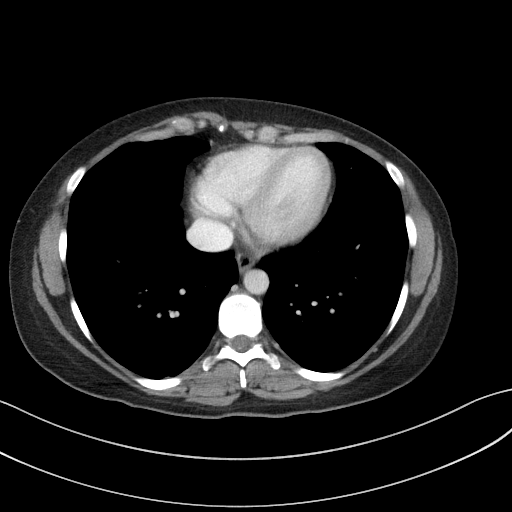

[Series 6: a/p w/ cor · coronal · 0.79mm/px · 3 of 128 slices shown]
[im 43/128  soft-tissue]
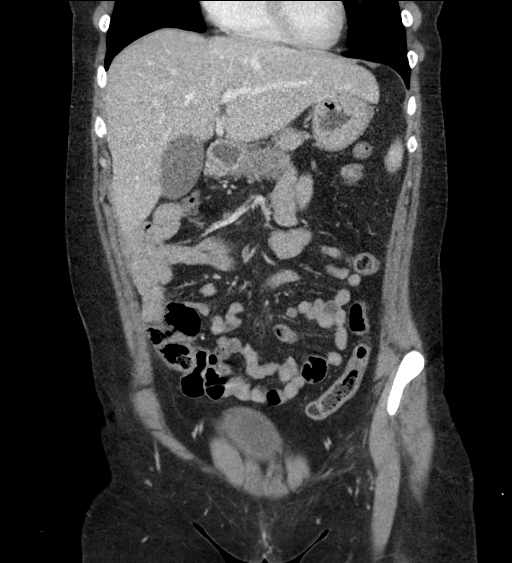
[im 57/128  soft-tissue]
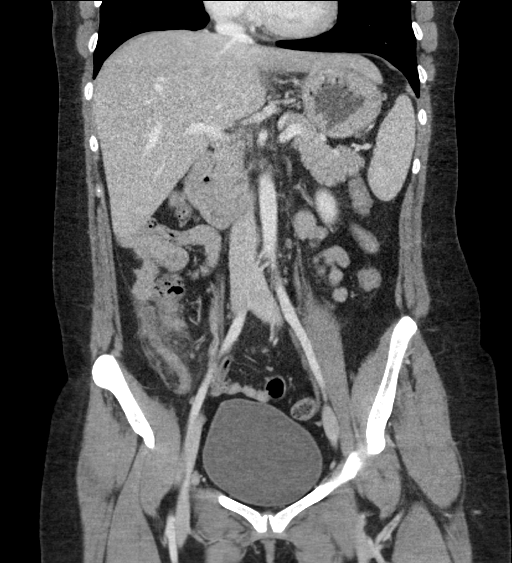
[im 71/128  soft-tissue]
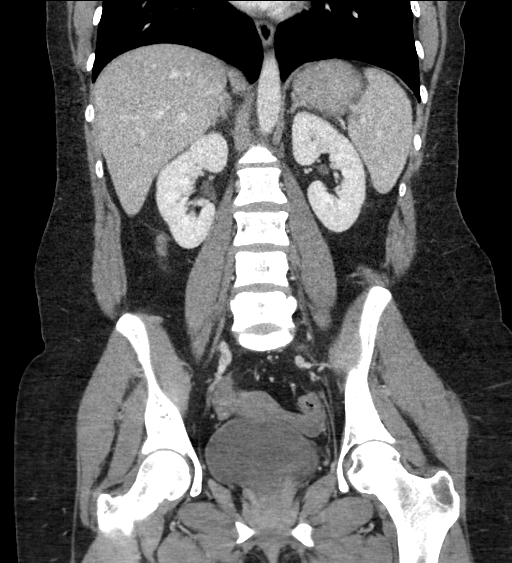

[16 of 46 positions shown; findings below may reference images not displayed]

FINDINGS: Lower chest: No acute abnormality.

Hepatobiliary: No focal liver abnormality is seen. No gallstones,
gallbladder wall thickening, or biliary dilatation.

Pancreas: Unremarkable. No pancreatic ductal dilatation or
surrounding inflammatory changes.

Spleen: Normal in size without focal abnormality.

Adrenals/Urinary Tract: Adrenal glands are unremarkable. Kidneys are
normal, without renal calculi, focal lesion, or hydronephrosis.
Bladder is unremarkable.

Stomach/Bowel: Stomach is within normal limits. No evidence of bowel
wall thickening, distention, or inflammatory changes.

Appendix: Location: Retrocecal

Diameter: 12 mm.  Periappendiceal inflammatory changes.

Appendicolith: No

Mucosal hyper-enhancement: Yes

Extraluminal gas: No

Periappendiceal collection: No

Vascular/Lymphatic: No significant vascular findings are present. No
enlarged abdominal or pelvic lymph nodes.

Reproductive: Uterus and bilateral adnexa are unremarkable.

Other: No abdominal wall hernia or abnormality. No abdominopelvic
ascites.

Musculoskeletal: No acute or significant osseous findings.
IMPRESSION: 1. Findings consistent with acute appendicitis. No evidence of
perforation or abscess formation.

## 2021-08-13 ENCOUNTER — Other Ambulatory Visit (HOSPITAL_BASED_OUTPATIENT_CLINIC_OR_DEPARTMENT_OTHER): Payer: Self-pay

## 2021-08-13 ENCOUNTER — Ambulatory Visit: Payer: No Typology Code available for payment source | Attending: Internal Medicine

## 2021-08-13 DIAGNOSIS — Z23 Encounter for immunization: Secondary | ICD-10-CM

## 2021-08-13 MED ORDER — PFIZER COVID-19 VAC BIVALENT 30 MCG/0.3ML IM SUSP
INTRAMUSCULAR | 0 refills | Status: DC
  Filled 2021-08-13: qty 0.3, 1d supply, fill #0

## 2021-08-13 MED ORDER — INFLUENZA VAC SPLIT QUAD 0.5 ML IM SUSY
PREFILLED_SYRINGE | INTRAMUSCULAR | 0 refills | Status: DC
  Filled 2021-08-13: qty 0.5, 1d supply, fill #0

## 2021-08-13 NOTE — Progress Notes (Signed)
   Covid-19 Vaccination Clinic  Name:  Jessica Graham    MRN: [id number] DOB: 27-Feb-1986  08/13/2021  [name] was observed post Covid-19 immunization for 15 minutes without incident. She was provided with Vaccine Information Sheet and instruction to access the V-Safe system.   [name] was instructed to call 911 with any severe reactions post vaccine: Difficulty breathing  Swelling of face and throat  A fast heartbeat  A bad rash all over body  Dizziness and weakness   Immunizations Administered     Name Date Dose VIS Date Route   Pfizer Covid-19 Vaccine Bivalent Booster 08/13/2021 10:09 AM 0.3 mL 05/15/2021 Intramuscular   Manufacturer: [employer], [employer]   Lot: [id number]   NDC: [phone]

## 2021-10-30 ENCOUNTER — Encounter (HOSPITAL_BASED_OUTPATIENT_CLINIC_OR_DEPARTMENT_OTHER): Payer: Self-pay | Admitting: Nurse Practitioner

## 2021-10-30 ENCOUNTER — Ambulatory Visit (INDEPENDENT_AMBULATORY_CARE_PROVIDER_SITE_OTHER): Payer: No Typology Code available for payment source | Admitting: Nurse Practitioner

## 2021-10-30 ENCOUNTER — Other Ambulatory Visit: Payer: Self-pay

## 2021-10-30 VITALS — BP 128/88 | HR 107 | Ht 66.0 in | Wt 171.0 lb

## 2021-10-30 DIAGNOSIS — R5383 Other fatigue: Secondary | ICD-10-CM | POA: Insufficient documentation

## 2021-10-30 DIAGNOSIS — F900 Attention-deficit hyperactivity disorder, predominantly inattentive type: Secondary | ICD-10-CM | POA: Diagnosis not present

## 2021-10-30 DIAGNOSIS — N921 Excessive and frequent menstruation with irregular cycle: Secondary | ICD-10-CM | POA: Insufficient documentation

## 2021-10-30 DIAGNOSIS — Z23 Encounter for immunization: Secondary | ICD-10-CM | POA: Diagnosis not present

## 2021-10-30 HISTORY — DX: Other fatigue: R53.83

## 2021-10-30 NOTE — Progress Notes (Signed)
[name], DNP, AGNP-c Primary Care & Sports Medicine [address]   [address] [place], [place] [address] [phone]  New patient visit   Patient: Jessica Graham   DOB: 1986/09/06   36 y.o. Female  MRN: [id number] Visit Date: 10/30/2021  Patient Care Team: Gwyndolyn Guilford, Sung Amabile, NP as PCP - General (Nurse Practitioner)  Today's healthcare provider: [name], NP   Chief Complaint  Patient presents with   New Patient (Initial Visit)    Patient presents today to establish care. Patient expressed having labs as it has been a long time. She has general questions regarding guardasil. And she feels tired all the time.    Subjective    Jessica Graham is a 36 y.o. female who presents today as a new patient to establish care.  Jessica Graham lives at home with her 1-year-old daughter Jessica Graham and her husband.  She tells me she feels safe at home and in her relationship.  She endorses drinking alcohol on social occasions.  She has never used recreational drugs or smoked.  Patient endorses the following concerns presently: Gardasil vaccine Patient has questions about the Gardasil vaccine and whether she is eligible to receive additional vaccinations.  She reports she received her initial vaccination shortly after giving birth to her daughter approximately 2 years ago she did not receive any of the additional vaccines in the series and is interested in receiving this today.  Fatigue Patient endorses decreased energy and fatigue. She has a longstanding history of ADHD which is Jessica Graham severely exacerbated by the increased fatigue making it very difficult for her to function. She reports that she sleeps well at night she does occasionally wake up but goes back to sleep easily.  She goes to bed around 9 PM and sleeps throughout the night most of the time. She denies any history of snoring or awakenings gasping for air. She has no history of heart palpitations or chest pain.  No known  history of anemia.  Irregular menstrual periods Patient started on oral contraceptive after giving birth to her daughter and has been on continuous use since that time.  She reports that up until the last 4 to 6 weeks she had not experienced any breakthrough bleeding however over the last 4 to 5 weeks she has had intermittent 3 to 4 days of spotting.  She is on a progestin only pill.  She endorses not taking it at the exact same time every day.  Weight gain Patient endorses difficulty losing weight.  She tells me after having her daughter she did lose the majority of the gained weight through diet and exercise.  She reports she has not been able to exercise as much as she had in the past however she has been tracking Calories very carefully and has still been putting on additional weight which is unusual for her.  She has no personal history of thyroid disease or insulin resistance.  She is unsure of her family history as she was adopted and does not have access to this information. History reviewed and reveals the following: Past Medical History:  Diagnosis Date   ADD (attention deficit disorder)    Anxiety    Depression    Gestational hypertension    Past Surgical History:  Procedure Laterality Date   LAPAROSCOPIC APPENDECTOMY N/A 10/19/2020   Procedure: APPENDECTOMY LAPAROSCOPIC;  Surgeon: Axel Filler, MD;  Location: [facility] OR;  Service: General;  Laterality: N/A;   TONSILLECTOMY  2008   No  family status information on file.   History reviewed. No pertinent family history. Social History   Socioeconomic History   Marital status: Married    Spouse name: Not on file   Number of children: Not on file   Years of education: Not on file   Highest education level: Not on file  Occupational History   Not on file  Tobacco Use   Smoking status: Never   Smokeless tobacco: Never  Substance and Sexual Activity   Alcohol use: Yes   Drug use: Never   Sexual activity: Yes    Birth  control/protection: Pill  Other Topics Concern   Not on file  Social History Narrative   Not on file   Social Determinants of Health   Financial Resource Strain: Not on file  Food Insecurity: Not on file  Transportation Needs: Not on file  Physical Activity: Not on file  Stress: Not on file  Social Connections: Not on file   Outpatient Medications Prior to Visit  Medication Sig   norethindrone (MICRONOR) 0.35 MG tablet Take 1 tablet by mouth every morning.   [DISCONTINUED] sertraline (ZOLOFT) 100 MG tablet Take 1 tablet by mouth daily.   amphetamine-dextroamphetamine (ADDERALL) 20 MG tablet Take 20 mg by mouth daily.   COVID-19 mRNA bivalent vaccine, Pfizer, (PFIZER COVID-19 VAC BIVALENT) injection Inject into the muscle.   influenza vac split quadrivalent PF (FLUARIX) 0.5 ML injection Inject into the muscle.   ipratropium (ATROVENT) 0.06 % nasal spray Place into both nostrils.   omeprazole (PRILOSEC) 20 MG capsule Take 20 mg by mouth daily.   VYVANSE 60 MG capsule Take 60 mg by mouth every morning.   [DISCONTINUED] norethindrone (MICRONOR) 0.35 MG tablet Take 1 tablet by mouth daily.   [DISCONTINUED] sertraline (ZOLOFT) 100 MG tablet Take 100 mg by mouth 1 day or 1 dose.   No facility-administered medications prior to visit.   No Known Allergies Immunization History  Administered Date(s) Administered   HPV 9-valent 07/27/2019, 10/30/2021   Influenza Split 07/11/2015   Influenza,inj,Quad PF,6+ Mos 06/11/2017, 12/07/2018, 05/27/2019   Influenza,inj,quad, With Preservative 05/16/2020   Influenza-Unspecified 06/15/2021   PFIZER Comirnaty(Gray Top)Covid-19 Tri-Sucrose Vaccine 11/11/2019, 12/02/2019   PFIZER(Purple Top)SARS-COV-2 Vaccination 08/22/2020   Pfizer Covid-19 Vaccine Bivalent Booster 58yrs & up 08/13/2021   Tdap 04/20/2015, 07/11/2015, 04/27/2019    Health Maintenance Due: Health Maintenance  Topic Date Due   HIV Screening  Never done   Hepatitis C Screening   Never done   PAP SMEAR-Modifier  Never done   TETANUS/TDAP  04/26/2029   INFLUENZA VACCINE  Completed   COVID-19 Vaccine  Completed   HPV VACCINES  Aged Out    Review of Systems All review of systems negative except what is listed in the HPI   Objective    BP 128/88    Pulse (!) 107    Ht 5\' 6"  (1.676 m)    Wt 171 lb (77.6 kg)    SpO2 97%    BMI 27.60 kg/m  Physical Exam Vitals and nursing note reviewed.  Constitutional:      General: She is not in acute distress.    Appearance: Normal appearance.  Eyes:     Extraocular Movements: Extraocular movements intact.     Conjunctiva/sclera: Conjunctivae normal.     Pupils: Pupils are equal, round, and reactive to light.  Neck:     Vascular: No carotid bruit.  Cardiovascular:     Rate and Rhythm: Normal rate and regular rhythm.  Pulses: Normal pulses.     Heart sounds: Normal heart sounds. No murmur heard. Pulmonary:     Effort: Pulmonary effort is normal.     Breath sounds: Normal breath sounds. No wheezing.  Abdominal:     General: Bowel sounds are normal.     Palpations: Abdomen is soft.  Musculoskeletal:        General: Normal range of motion.     Cervical back: Normal range of motion.     Right lower leg: No edema.     Left lower leg: No edema.  Skin:    General: Skin is warm and dry.     Capillary Refill: Capillary refill takes less than 2 seconds.  Neurological:     General: No focal deficit present.     Mental Status: She is alert and oriented to person, place, and time.  Psychiatric:        Mood and Affect: Mood normal.        Behavior: Behavior normal.        Thought Content: Thought content normal.        Judgment: Judgment normal.    No results found for any visits on 10/30/21.  Assessment & Plan      Problem List Items Addressed This Visit     Attention deficit hyperactivity disorder, predominantly inattentive type - Primary    Longstanding history of ADHD currently well managed with Vyvanse 60 mg  every morning and Adderall 20 mg in the afternoon. Increase exacerbation of symptoms with recent increase in fatigue.  No concerns with weight loss or appetite suppression. Continue current medication at this time.  Discussed with patient that I am happy to take over medication management for this if she wishes.      Other fatigue    Significant increase in fatigue of an unknown etiology. Patient does have an additional concerns with menstrual cycle changes as well as intolerance to heat and weight gain. We will obtain labs today for further evaluation.  No signs of OSA or poor sleep hygiene present today. We will make recommendations based on findings as appropriate.      Relevant Orders   CBC with Differential/Platelet   Comprehensive metabolic panel   Lipid panel   Hemoglobin A1c   TSH   VITAMIN D 25 Hydroxy (Vit-D Deficiency, Fractures)   T4   T3   B12 and Folate Panel   Need for HPV vaccine    HPV vaccine provided today. Patient will follow-up in 6 months for repeat vaccination to complete the series.      Relevant Orders   HPV 9-valent vaccine,Recombinat (Completed)   Breakthrough bleeding on birth control pills    Intermittent spotting while on progestin only OCP. Discussed with patient the importance of taking medication at the same time every day for maintenance of appropriate blood levels. We will obtain labs today for evaluation as she is also experiencing some symptoms that could indicate possible thyroid dysfunction. Patient is no longer breast-feeding and we could consider changing OCP to a estrogen progestin combination pill for improved menstrual cycle control if laboratory evaluation does not show any concerning findings. We will plan to follow-up based on lab results.        Return in about 6 months (around 04/29/2022) for 3rd HPV vaccine.      Mariane Burpee, Sung Amabile, NP, DNP, AGNP-C Primary Care & Sports Medicine at [facility]

## 2021-10-30 NOTE — Assessment & Plan Note (Addendum)
HPV vaccine provided today. Patient will follow-up in 6 months for repeat vaccination to complete the series.

## 2021-10-30 NOTE — Assessment & Plan Note (Signed)
Intermittent spotting while on progestin only OCP. Discussed with patient the importance of taking medication at the same time every day for maintenance of appropriate blood levels. We will obtain labs today for evaluation as she is also experiencing some symptoms that could indicate possible thyroid dysfunction. Patient is no longer breast-feeding and we could consider changing OCP to a estrogen progestin combination pill for improved menstrual cycle control if laboratory evaluation does not show any concerning findings. We will plan to follow-up based on lab results.

## 2021-10-30 NOTE — Assessment & Plan Note (Signed)
Significant increase in fatigue of an unknown etiology. Patient does have an additional concerns with menstrual cycle changes as well as intolerance to heat and weight gain. We will obtain labs today for further evaluation.  No signs of OSA or poor sleep hygiene present today. We will make recommendations based on findings as appropriate.

## 2021-10-30 NOTE — Assessment & Plan Note (Signed)
Longstanding history of ADHD currently well managed with Vyvanse 60 mg every morning and Adderall 20 mg in the afternoon. Increase exacerbation of symptoms with recent increase in fatigue.  No concerns with weight loss or appetite suppression. Continue current medication at this time.  Discussed with patient that I am happy to take over medication management for this if she wishes.

## 2021-10-30 NOTE — Patient Instructions (Signed)
Thank you for choosing [place] at [facility] for your Primary Care needs. I am excited for the opportunity to partner with you to meet your health care goals. It was a pleasure meeting you today!  Recommendations from today's visit: I would like to get some labs today to check about the fatigue. I will contact you and let you know if we have any recommendations based on these findings.  You received your second gardasil vaccine today, we will plan to do the last vaccine 6 months from now.   Information on diet, exercise, and health maintenance recommendations are listed below. This is information to help you be sure you are on track for optimal health and monitoring.   Please look over this and let us know if you have any questions or if you have completed any of the health maintenance outside of [place] so that we can be sure your records are up to date.  ___________________________________________________________ About Me: I am an Adult-Geriatric Nurse Practitioner with a background in caring for patients for more than 20 years with a strong intensive care background. I provide primary care and sports medicine services to patients age 49 and older within this office. My education had a strong focus on caring for the older adult population, which I am passionate about. I am also the director of the APP Fellowship with [facility].   My desire is to provide you with the best service through preventive medicine and supportive care. I consider you a part of the medical team and value your input. I work diligently to ensure that you are heard and your needs are met in a safe and effective manner. I want you to feel comfortable with me as your provider and want you to know that your health concerns are important to me.  For your information, our office hours are: Monday, Tuesday, and Thursday 8:00 AM - 5:00 PM Wednesday and Friday 8:00 AM - 12:00 PM.   In my time away from  the office I am teaching new APP's within the system and am unavailable, but my partner, Dr. Burnard Bunting is in the office for emergent needs.   If you have questions or concerns, please call our office at [phone] or send [place] a MyChart message and we will respond as quickly as possible.  ____________________________________________________________ MyChart:  For all urgent or time sensitive needs we ask that you please call the office to avoid delays. Our number is [phone]. MyChart is not constantly monitored and due to the large volume of messages a day, replies may take up to 72 business hours.  MyChart Policy: MyChart allows for you to see your visit notes, after visit summary, provider recommendations, lab and tests results, make an appointment, request refills, and contact your provider or the office for non-urgent questions or concerns. Providers are seeing patients during normal business hours and do not have built in time to review MyChart messages.  We ask that you allow a minimum of 3 business days for responses to [employer]. For this reason, please do not send urgent requests through [place]. Please call the office at [phone]. New and ongoing conditions may require a visit. We have virtual and in person visit available for your convenience.  Complex MyChart concerns may require a visit. Your provider may request you schedule a virtual or in person visit to ensure we are providing the best care possible. MyChart messages sent after 11:00 AM on Friday will not be  received by the provider until Monday morning.    Lab and Test Results: You will receive your lab and test results on MyChart as soon as they are completed and results have been sent by the lab or testing facility. Due to this service, you will receive your results BEFORE your provider.  I review lab and tests results each morning prior to seeing patients. Some results require collaboration with other providers to  ensure you are receiving the most appropriate care. For this reason, we ask that you please allow a minimum of 3-5 business days from the time the ALL results have been received for your provider to receive and review lab and test results and contact you about these.  Most lab and test result comments from the provider will be sent through [place]. Your provider may recommend changes to the plan of care, follow-up visits, repeat testing, ask questions, or request an office visit to discuss these results. You may reply directly to this message or call the office at [phone] to provide information for the provider or set up an appointment. In some instances, you will be called with test results and recommendations. Please let us know if this is preferred and we will make note of this in your chart to provide this for you.    If you have not heard a response to your lab or test results in 5 business days from all results returning to [place], please call the office to let us know. We ask that you please avoid calling prior to this time unless there is an emergent concern. Due to high call volumes, this can delay the resulting process.  After Hours: For all non-emergency after hours needs, please call the office at [phone] and select the option to reach the on-call provider service. On-call services are shared between multiple [place] offices and therefore it will not be possible to speak directly with your provider. On-call providers may provide medical advice and recommendations, but are unable to provide refills for maintenance medications.  For all emergency or urgent medical needs after normal business hours, we recommend that you seek care at the closest Urgent Care or Emergency Department to ensure appropriate treatment in a timely manner.  [facility] [place] [facility] has a 24 hour emergency room located on the ground floor for your convenience.   Urgent Concerns During the Business  Day Providers are seeing patients from 8AM to [place] with a busy schedule and are most often not able to respond to non-urgent calls until the end of the day or the next business day. If you should have URGENT concerns during the day, please call and speak to the nurse or schedule a same day appointment so that we can address your concern without delay.   Thank you, again, for choosing me as your health care partner. I appreciate your trust and look forward to learning more about you.   Worthy Keeler, DNP, AGNP-c ___________________________________________________________  Health Maintenance Recommendations Screening Testing Mammogram Every 1 -2 years based on history and risk factors Starting at age 81 Pap Smear Ages 21-39 every 3 years Ages 7-65 every 5 years with HPV testing More frequent testing may be required based on results and history Colon Cancer Screening Every 1-10 years based on test performed, risk factors, and history Starting at age 37 Bone Density Screening Every 2-10 years based on history Starting at age 46 for women Recommendations for men differ based on medication usage, history, and risk factors AAA  Screening One time ultrasound Men 12-55 years old who have every smoked Lung Cancer Screening Low Dose Lung CT every 12 months Age 31-80 years with a 30 pack-year smoking history who still smoke or who have quit within the last 15 years  Screening Labs Routine  Labs: Complete Blood Count (CBC), Complete Metabolic Panel (CMP), Cholesterol (Lipid Panel) Every 6-12 months based on history and medications May be recommended more frequently based on current conditions or previous results Hemoglobin A1c Lab Every 3-12 months based on history and previous results Starting at age 62 or earlier with diagnosis of diabetes, high cholesterol, BMI >26, and/or risk factors Frequent monitoring for patients with diabetes to ensure blood sugar control Thyroid Panel (TSH w/ T3  & T4) Every 6 months based on history, symptoms, and risk factors May be repeated more often if on medication HIV One time testing for all patients 27 and older May be repeated more frequently for patients with increased risk factors or exposure Hepatitis C One time testing for all patients 61 and older May be repeated more frequently for patients with increased risk factors or exposure Gonorrhea, Chlamydia Every 12 months for all sexually active persons 13-24 years Additional monitoring may be recommended for those who are considered high risk or who have symptoms PSA Men 19-43 years old with risk factors Additional screening may be recommended from age 43-69 based on risk factors, symptoms, and history  Vaccine Recommendations Tetanus Booster All adults every 10 years Flu Vaccine All patients 6 months and older every year COVID Vaccine All patients 12 years and older Initial dosing with booster May recommend additional booster based on age and health history HPV Vaccine 2 doses all patients age 49-26 Dosing may be considered for patients over 26 Shingles Vaccine (Shingrix) 2 doses all adults 25 years and older Pneumonia (Pneumovax 23) All adults 62 years and older May recommend earlier dosing based on health history Pneumonia (Prevnar 35) All adults 65 years and older Dosed 1 year after Pneumovax 23  Additional Screening, Testing, and Vaccinations may be recommended on an individualized basis based on family history, health history, risk factors, and/or exposure.  __________________________________________________________  Diet Recommendations for All Patients  I recommend that all patients maintain a diet low in saturated fats, carbohydrates, and cholesterol. While this can be challenging at first, it is not impossible and small changes can make big differences.  Things to try: Decreasing the amount of soda, sweet tea, and/or juice to one or less per day and replace with  water While water is always the first choice, if you do not like water you may consider adding a water additive without sugar to improve the taste other sugar free drinks Replace potatoes with a brightly colored vegetable at dinner Use healthy oils, such as canola oil or olive oil, instead of butter or hard margarine Limit your bread intake to two pieces or less a day Replace regular pasta with low carb pasta options Bake, broil, or grill foods instead of frying Monitor portion sizes  Eat smaller, more frequent meals throughout the day instead of large meals  An important thing to remember is, if you love foods that are not great for your health, you don't have to give them up completely. Instead, allow these foods to be a reward when you have done well. Allowing yourself to still have special treats every once in a while is a nice way to tell yourself thank you for working hard to keep yourself healthy.  Also remember that every day is a new day. If you have a bad day and "fall off the wagon", you can still climb right back up and keep moving along on your journey!  We have resources available to help you!  Some websites that may be helpful include: www.http://carter.biz/  Www.VeryWellFit.com _____________________________________________________________  Activity Recommendations for All Patients  I recommend that all adults get at least 20 minutes of moderate physical activity that elevates your heart rate at least 5 days out of the week.  Some examples include: Walking or jogging at a pace that allows you to carry on a conversation Cycling (stationary bike or outdoors) Water aerobics Yoga Weight lifting Dancing If physical limitations prevent you from putting stress on your joints, exercise in a pool or seated in a chair are excellent options.  Do determine your MAXIMUM heart rate for activity: YOUR AGE - 220 = MAX HeartRate   Remember! Do not push yourself too hard.  Start slowly  and build up your pace, speed, weight, time in exercise, etc.  Allow your body to rest between exercise and get good sleep. You will need more water than normal when you are exerting yourself. Do not wait until you are thirsty to drink. Drink with a purpose of getting in at least 8, 8 ounce glasses of water a day plus more depending on how much you exercise and sweat.    If you begin to develop dizziness, chest pain, abdominal pain, jaw pain, shortness of breath, headache, vision changes, lightheadedness, or other concerning symptoms, stop the activity and allow your body to rest. If your symptoms are severe, seek emergency evaluation immediately. If your symptoms are concerning, but not severe, please let us know so that we can recommend further evaluation.

## 2021-11-05 LAB — CBC WITH DIFFERENTIAL/PLATELET
Basophils Absolute: 0.1 10*3/uL (ref 0.0–0.2)
Basos: 1 %
EOS (ABSOLUTE): 0.1 10*3/uL (ref 0.0–0.4)
Eos: 2 %
Hematocrit: 37.1 % (ref 34.0–46.6)
Hemoglobin: 12 g/dL (ref 11.1–15.9)
Immature Grans (Abs): 0 10*3/uL (ref 0.0–0.1)
Immature Granulocytes: 0 %
Lymphocytes Absolute: 2.1 10*3/uL (ref 0.7–3.1)
Lymphs: 33 %
MCH: 30.2 pg (ref 26.6–33.0)
MCHC: 32.3 g/dL (ref 31.5–35.7)
MCV: 93 fL (ref 79–97)
Monocytes Absolute: 0.6 10*3/uL (ref 0.1–0.9)
Monocytes: 9 %
Neutrophils Absolute: 3.4 10*3/uL (ref 1.4–7.0)
Neutrophils: 55 %
Platelets: 348 10*3/uL (ref 150–450)
RBC: 3.98 x10E6/uL (ref 3.77–5.28)
RDW: 11.8 % (ref 11.7–15.4)
WBC: 6.2 10*3/uL (ref 3.4–10.8)

## 2021-11-05 LAB — LIPID PANEL
Chol/HDL Ratio: 5.3 ratio — ABNORMAL HIGH (ref 0.0–4.4)
Cholesterol, Total: 175 mg/dL (ref 100–199)
HDL: 33 mg/dL — ABNORMAL LOW (ref >39)
LDL Chol Calc (NIH): 120 mg/dL — ABNORMAL HIGH (ref 0–99)
Triglycerides: 118 mg/dL (ref 0–149)
VLDL Cholesterol Cal: 22 mg/dL (ref 5–40)

## 2021-11-05 LAB — COMPREHENSIVE METABOLIC PANEL
ALT: 15 IU/L (ref 0–32)
AST: 21 IU/L (ref 0–40)
Albumin/Globulin Ratio: 1.8 (ref 1.2–2.2)
Albumin: 4.7 g/dL (ref 3.8–4.8)
Alkaline Phosphatase: 88 IU/L (ref 44–121)
BUN/Creatinine Ratio: 17 (ref 9–23)
BUN: 12 mg/dL (ref 6–20)
Bilirubin Total: 0.3 mg/dL (ref 0.0–1.2)
CO2: 21 mmol/L (ref 20–29)
Calcium: 9.8 mg/dL (ref 8.7–10.2)
Chloride: 105 mmol/L (ref 96–106)
Creatinine, Ser: 0.7 mg/dL (ref 0.57–1.00)
Globulin, Total: 2.6 g/dL (ref 1.5–4.5)
Glucose: 74 mg/dL (ref 70–99)
Potassium: 4.6 mmol/L (ref 3.5–5.2)
Sodium: 139 mmol/L (ref 134–144)
Total Protein: 7.3 g/dL (ref 6.0–8.5)
eGFR: 117 mL/min/{1.73_m2} (ref >59)

## 2021-11-05 LAB — VITAMIN D 25 HYDROXY (VIT D DEFICIENCY, FRACTURES)

## 2021-11-05 LAB — T4: T4, Total: 5.7 ug/dL (ref 4.5–12.0)

## 2021-11-05 LAB — HEMOGLOBIN A1C
Est. average glucose Bld gHb Est-mCnc: 108 mg/dL
Hgb A1c MFr Bld: 5.4 % (ref 4.8–5.6)

## 2021-11-05 LAB — TSH: TSH: 1.57 u[IU]/mL (ref 0.450–4.500)

## 2021-11-05 LAB — B12 AND FOLATE PANEL
Folate: 18.8 ng/mL (ref >3.0)
Vitamin B-12: 319 pg/mL (ref 232–1245)

## 2021-11-05 LAB — T3: T3, Total: 127 ng/dL (ref 71–180)

## 2021-11-08 ENCOUNTER — Encounter (HOSPITAL_BASED_OUTPATIENT_CLINIC_OR_DEPARTMENT_OTHER): Payer: Self-pay | Admitting: Nurse Practitioner

## 2021-11-22 ENCOUNTER — Encounter (HOSPITAL_BASED_OUTPATIENT_CLINIC_OR_DEPARTMENT_OTHER): Payer: Self-pay | Admitting: Nurse Practitioner

## 2021-11-25 ENCOUNTER — Other Ambulatory Visit (HOSPITAL_BASED_OUTPATIENT_CLINIC_OR_DEPARTMENT_OTHER): Payer: Self-pay | Admitting: Nurse Practitioner

## 2021-11-26 ENCOUNTER — Other Ambulatory Visit (HOSPITAL_BASED_OUTPATIENT_CLINIC_OR_DEPARTMENT_OTHER): Payer: Self-pay | Admitting: Nurse Practitioner

## 2021-11-26 MED ORDER — ONDANSETRON HCL 8 MG PO TABS: 8.0000 mg | ORAL_TABLET | Freq: Three times a day (TID) | ORAL | 2 refills | Status: DC | PRN

## 2021-11-27 MED ORDER — VYVANSE 60 MG PO CAPS: 60.0000 mg | ORAL_CAPSULE | Freq: Every morning | ORAL | 0 refills | Status: DC

## 2021-11-27 MED ORDER — AMPHETAMINE-DEXTROAMPHETAMINE 20 MG PO TABS: 20.0000 mg | ORAL_TABLET | Freq: Every day | ORAL | 0 refills | Status: DC

## 2021-12-05 ENCOUNTER — Other Ambulatory Visit (HOSPITAL_COMMUNITY): Payer: Self-pay

## 2021-12-05 ENCOUNTER — Other Ambulatory Visit (HOSPITAL_BASED_OUTPATIENT_CLINIC_OR_DEPARTMENT_OTHER): Payer: Self-pay | Admitting: Nurse Practitioner

## 2021-12-05 MED ORDER — NORETHINDRONE 0.35 MG PO TABS
1.0000 | ORAL_TABLET | Freq: Every morning | ORAL | 0 refills | Status: DC
  Filled 2021-12-05: qty 28, 28d supply, fill #0

## 2021-12-09 ENCOUNTER — Encounter (HOSPITAL_BASED_OUTPATIENT_CLINIC_OR_DEPARTMENT_OTHER): Payer: Self-pay | Admitting: Nurse Practitioner

## 2021-12-10 ENCOUNTER — Other Ambulatory Visit (HOSPITAL_BASED_OUTPATIENT_CLINIC_OR_DEPARTMENT_OTHER): Payer: Self-pay | Admitting: Nurse Practitioner

## 2021-12-10 DIAGNOSIS — F900 Attention-deficit hyperactivity disorder, predominantly inattentive type: Secondary | ICD-10-CM

## 2021-12-10 MED ORDER — AMPHETAMINE-DEXTROAMPHETAMINE 20 MG PO TABS: 20.0000 mg | ORAL_TABLET | Freq: Two times a day (BID) | ORAL | 0 refills | Status: DC

## 2021-12-27 ENCOUNTER — Other Ambulatory Visit (HOSPITAL_BASED_OUTPATIENT_CLINIC_OR_DEPARTMENT_OTHER): Payer: Self-pay | Admitting: Nurse Practitioner

## 2021-12-27 MED ORDER — VYVANSE 60 MG PO CAPS: 60.0000 mg | ORAL_CAPSULE | Freq: Every morning | ORAL | 0 refills | Status: DC

## 2022-01-13 ENCOUNTER — Other Ambulatory Visit (HOSPITAL_BASED_OUTPATIENT_CLINIC_OR_DEPARTMENT_OTHER): Payer: Self-pay | Admitting: Nurse Practitioner

## 2022-01-13 DIAGNOSIS — F900 Attention-deficit hyperactivity disorder, predominantly inattentive type: Secondary | ICD-10-CM

## 2022-01-13 MED ORDER — AMPHETAMINE-DEXTROAMPHETAMINE 20 MG PO TABS: 20.0000 mg | ORAL_TABLET | Freq: Two times a day (BID) | ORAL | 0 refills | Status: DC

## 2022-01-13 MED ORDER — NORETHINDRONE 0.35 MG PO TABS: 1.0000 | ORAL_TABLET | Freq: Every morning | ORAL | 0 refills | Status: DC

## 2022-01-13 NOTE — Progress Notes (Signed)
? ?  Subjective:  ?  ? Jessica Graham is a 36 y.o. female here at [facility] for a routine exam.  Current complaints: breakthrough bleeding on POPs.  Personal health questionnaire reviewed: yes. ? ?Do you have a primary care provider? yes ?Do you feel safe at home? yes ? ?[place] Office Visit from 01/14/2022 in [place]  ?PHQ-2 Total Score 0  ? ?  ? ? ?Health Maintenance Due  ?Topic Date Due  ? HIV Screening  Never done  ? Hepatitis C Screening  Never done  ? PAP SMEAR-Modifier  Never done  ?  ? ?Risk factors for chronic health problems: ?Smoking: Never ?Alchohol/how much: occasional ?Pt BMI: Body mass index is 26.99 kg/m?. ?  ?Gynecologic History ?No LMP recorded. (Menstrual status: Oral contraceptives). ?Contraception: oral progesterone-only contraceptive ?Last Pap: 2020. 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 Recommended follow up was Pap postpartum.   ?Last mammogram: n/a.  ? ?Obstetric History ?OB History  ?No obstetric history on file.  ? ? ? ?The following portions of the patient's history were reviewed and updated as appropriate: allergies, current medications, past family history, past medical history, past social history, past surgical history, and problem list. ? ?Review of Systems ?Pertinent items noted in HPI and remainder of comprehensive ROS otherwise negative.  ?  ?Objective:  ? ?BP 125/86 (BP Location: Left Arm, Patient Position: Sitting, Cuff Size: Large)   Pulse 100   Ht 5\' 6"  (1.676 m) Comment: reported  Wt 167 lb 3.2 oz (75.8 kg)   BMI 26.99 kg/m?  ?VS reviewed, nursing note reviewed,  ?Constitutional: well developed, well nourished, no distress ?HEENT: normocephalic ?CV: normal rate ?Pulm/chest wall: normal effort ?Breast Exam:  Deferred with low risks and shared decision making, discussed recommendation to start mammogram between 40-50 yo/ Abdomen: soft ?Neuro: alert and oriented x 3 ?Skin: warm, dry ?Psych: affect normal ?Pelvic exam:   Performed: Cervix pink, visually closed, without lesion, scant white creamy discharge, vaginal walls and external genitalia normal ?Bimanual exam: Cervix 0/long/high, firm, anterior, neg CMT, uterus nontender, nonenlarged, adnexa without tenderness, enlargement, or mass  ? ? ?   ?Assessment/Plan:  ? ?1. Encounter for counseling regarding contraception ?--Pt taking micronor, and is having 2 episodes of bleeding each month at this time. Discussed contraceptive options, pt prefers to continue pills, will switch to OCPs for improved menses. ?--No hx DVT/PE, or other risk factors ?- Norethindrone Acetate-Ethinyl Estrad-FE (LOESTRIN 24 FE) 1-20 MG-MCG(24) tablet; Take 1 tablet by mouth daily.  Dispense: 28 tablet; Refill: 11 ? ?2. 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  Recommended follow up was repeat Pap, pt was postpartum, has not followed up until now. ? ?3. Well woman exam with routine gynecological exam ?--Doing well, irregular bleeding on POPs.  Hx HSIL, Pap today with follow up dependent upon results.   ? ? ? ?No follow-ups on file.  ? ?Fatima Blank, CNM ?12:32 PM   ?

## 2022-01-14 ENCOUNTER — Encounter (HOSPITAL_BASED_OUTPATIENT_CLINIC_OR_DEPARTMENT_OTHER): Payer: Self-pay | Admitting: Advanced Practice Midwife

## 2022-01-14 ENCOUNTER — Ambulatory Visit (INDEPENDENT_AMBULATORY_CARE_PROVIDER_SITE_OTHER): Payer: No Typology Code available for payment source | Admitting: Advanced Practice Midwife

## 2022-01-14 ENCOUNTER — Other Ambulatory Visit (HOSPITAL_COMMUNITY)
Admission: RE | Admit: 2022-01-14 | Discharge: 2022-01-14 | Disposition: A | Discharge status: Home / Self Care | Payer: No Typology Code available for payment source | Source: Ambulatory Visit | Attending: Advanced Practice Midwife | Admitting: Advanced Practice Midwife

## 2022-01-14 VITALS — BP 125/86 | HR 100 | Ht 66.0 in | Wt 167.2 lb

## 2022-01-14 DIAGNOSIS — R87619 Unspecified abnormal cytological findings in specimens from cervix uteri: Secondary | ICD-10-CM | POA: Insufficient documentation

## 2022-01-14 DIAGNOSIS — N921 Excessive and frequent menstruation with irregular cycle: Secondary | ICD-10-CM | POA: Diagnosis not present

## 2022-01-14 DIAGNOSIS — Z01419 Encounter for gynecological examination (general) (routine) without abnormal findings: Secondary | ICD-10-CM | POA: Diagnosis not present

## 2022-01-14 DIAGNOSIS — Z3009 Encounter for other general counseling and advice on contraception: Secondary | ICD-10-CM

## 2022-01-14 MED ORDER — NORETHIN ACE-ETH ESTRAD-FE 1-20 MG-MCG(24) PO TABS: 1.0000 | ORAL_TABLET | Freq: Every day | ORAL | 11 refills | Status: DC

## 2022-01-15 LAB — CYTOLOGY - PAP
Comment: NEGATIVE
Diagnosis: NEGATIVE
High risk HPV: NEGATIVE

## 2022-01-29 ENCOUNTER — Other Ambulatory Visit (HOSPITAL_BASED_OUTPATIENT_CLINIC_OR_DEPARTMENT_OTHER): Payer: Self-pay | Admitting: Nurse Practitioner

## 2022-02-01 ENCOUNTER — Other Ambulatory Visit (HOSPITAL_BASED_OUTPATIENT_CLINIC_OR_DEPARTMENT_OTHER): Payer: Self-pay | Admitting: Nurse Practitioner

## 2022-02-03 ENCOUNTER — Encounter (HOSPITAL_BASED_OUTPATIENT_CLINIC_OR_DEPARTMENT_OTHER): Payer: Self-pay | Admitting: Nurse Practitioner

## 2022-02-05 ENCOUNTER — Other Ambulatory Visit (HOSPITAL_BASED_OUTPATIENT_CLINIC_OR_DEPARTMENT_OTHER): Payer: Self-pay | Admitting: Nurse Practitioner

## 2022-02-05 MED ORDER — VYVANSE 60 MG PO CAPS: 60.0000 mg | ORAL_CAPSULE | Freq: Every morning | ORAL | 0 refills | Status: DC

## 2022-02-11 ENCOUNTER — Other Ambulatory Visit (HOSPITAL_BASED_OUTPATIENT_CLINIC_OR_DEPARTMENT_OTHER): Payer: Self-pay | Admitting: Nurse Practitioner

## 2022-02-11 DIAGNOSIS — F900 Attention-deficit hyperactivity disorder, predominantly inattentive type: Secondary | ICD-10-CM

## 2022-02-11 MED ORDER — AMPHETAMINE-DEXTROAMPHETAMINE 20 MG PO TABS: 20.0000 mg | ORAL_TABLET | Freq: Two times a day (BID) | ORAL | 0 refills | Status: DC

## 2022-03-05 ENCOUNTER — Other Ambulatory Visit (HOSPITAL_BASED_OUTPATIENT_CLINIC_OR_DEPARTMENT_OTHER): Payer: Self-pay | Admitting: Nurse Practitioner

## 2022-03-05 DIAGNOSIS — F900 Attention-deficit hyperactivity disorder, predominantly inattentive type: Secondary | ICD-10-CM

## 2022-03-05 MED ORDER — LISDEXAMFETAMINE DIMESYLATE 60 MG PO CAPS: 60.0000 mg | ORAL_CAPSULE | ORAL | 0 refills | Status: DC

## 2022-03-12 ENCOUNTER — Other Ambulatory Visit (HOSPITAL_BASED_OUTPATIENT_CLINIC_OR_DEPARTMENT_OTHER): Payer: Self-pay | Admitting: Nurse Practitioner

## 2022-03-12 DIAGNOSIS — F900 Attention-deficit hyperactivity disorder, predominantly inattentive type: Secondary | ICD-10-CM

## 2022-03-12 MED ORDER — AMPHETAMINE-DEXTROAMPHETAMINE 20 MG PO TABS: 20.0000 mg | ORAL_TABLET | Freq: Two times a day (BID) | ORAL | 0 refills | Status: DC

## 2022-04-04 ENCOUNTER — Other Ambulatory Visit (HOSPITAL_BASED_OUTPATIENT_CLINIC_OR_DEPARTMENT_OTHER): Payer: Self-pay | Admitting: Nurse Practitioner

## 2022-04-04 DIAGNOSIS — F900 Attention-deficit hyperactivity disorder, predominantly inattentive type: Secondary | ICD-10-CM

## 2022-04-07 ENCOUNTER — Other Ambulatory Visit (HOSPITAL_BASED_OUTPATIENT_CLINIC_OR_DEPARTMENT_OTHER): Payer: Self-pay | Admitting: Nurse Practitioner

## 2022-04-07 DIAGNOSIS — F900 Attention-deficit hyperactivity disorder, predominantly inattentive type: Secondary | ICD-10-CM

## 2022-04-07 MED ORDER — LISDEXAMFETAMINE DIMESYLATE 60 MG PO CAPS: 60.0000 mg | ORAL_CAPSULE | ORAL | 0 refills | Status: DC

## 2022-04-08 NOTE — Telephone Encounter (Signed)
Called pt and LVM to sch a med check/refill appt for around 8/25

## 2022-04-10 ENCOUNTER — Other Ambulatory Visit (HOSPITAL_BASED_OUTPATIENT_CLINIC_OR_DEPARTMENT_OTHER): Payer: Self-pay | Admitting: Nurse Practitioner

## 2022-04-10 DIAGNOSIS — F900 Attention-deficit hyperactivity disorder, predominantly inattentive type: Secondary | ICD-10-CM

## 2022-04-10 MED ORDER — AMPHETAMINE-DEXTROAMPHETAMINE 20 MG PO TABS: 20.0000 mg | ORAL_TABLET | Freq: Two times a day (BID) | ORAL | 0 refills | Status: DC

## 2022-04-14 ENCOUNTER — Encounter (HOSPITAL_BASED_OUTPATIENT_CLINIC_OR_DEPARTMENT_OTHER): Payer: Self-pay | Admitting: Nurse Practitioner

## 2022-04-14 DIAGNOSIS — F900 Attention-deficit hyperactivity disorder, predominantly inattentive type: Secondary | ICD-10-CM

## 2022-04-16 MED ORDER — LISDEXAMFETAMINE DIMESYLATE 30 MG PO CAPS: 60.0000 mg | ORAL_CAPSULE | ORAL | 0 refills | Status: DC

## 2022-04-16 NOTE — Telephone Encounter (Signed)
Dr Tommi Rumps [place] refilled medications.

## 2022-04-17 NOTE — Telephone Encounter (Signed)
Dr Tommi Rumps [place] sent refills

## 2022-04-18 ENCOUNTER — Ambulatory Visit (HOSPITAL_BASED_OUTPATIENT_CLINIC_OR_DEPARTMENT_OTHER): Payer: Managed Care, Other (non HMO)

## 2022-04-18 ENCOUNTER — Ambulatory Visit (INDEPENDENT_AMBULATORY_CARE_PROVIDER_SITE_OTHER): Payer: Managed Care, Other (non HMO) | Admitting: Family Medicine

## 2022-04-18 ENCOUNTER — Encounter (HOSPITAL_BASED_OUTPATIENT_CLINIC_OR_DEPARTMENT_OTHER): Payer: Self-pay | Admitting: Family Medicine

## 2022-04-18 DIAGNOSIS — S6992XA Unspecified injury of left wrist, hand and finger(s), initial encounter: Secondary | ICD-10-CM

## 2022-04-18 DIAGNOSIS — S6990XA Unspecified injury of unspecified wrist, hand and finger(s), initial encounter: Secondary | ICD-10-CM | POA: Insufficient documentation

## 2022-04-18 NOTE — Patient Instructions (Signed)
  Medication Instructions:  Your physician recommends that you continue on your current medications as directed. Please refer to the Current Medication list given to you today. --If you need a refill on any your medications before your next appointment, please call your pharmacy first. If no refills are authorized on file call the office.-- Lab Work: Your physician has recommended that you have lab work today: No If you have labs (blood work) drawn today and your tests are completely normal, you will receive your results via MyChart message OR a phone call from our staff.  Please ensure you check your voicemail in the event that you authorized detailed messages to be left on a delegated number. If you have any lab test that is abnormal or we need to change your treatment, we will call you to review the results.  Referrals/Procedures/Imaging: X-Ray   Follow-Up: Your next appointment:   Your physician recommends that you schedule a follow-up appointment as needed with Dr. de [place] or Enid Skeens, NP.  You will receive a text message or e-mail with a link to a survey about your care and experience with [place] today! We would greatly appreciate your feedback!   Thanks for letting us be apart of your health journey!!  Primary Care and Sports Medicine   Dr. Ceasar Mons [place]   We encourage you to activate your patient portal called "MyChart".  Sign up information is provided on this After Visit Summary.  MyChart is used to connect with patients for Virtual Visits (Telemedicine).  Patients are able to view lab/test results, encounter notes, upcoming appointments, etc.  Non-urgent messages can be sent to your provider as well. To learn more about what you can do with MyChart, please visit --  [email or web address].

## 2022-04-18 NOTE — Progress Notes (Signed)
    Procedures performed today:    None.  Independent interpretation of notes and tests performed by another provider:   None.  Brief History, Exam, Impression, and Recommendations:    BP (!) 120/97   Pulse 97   Ht 5\' 6"  (1.676 m)   Wt 162 lb 4.8 oz (73.6 kg)   SpO2 100%   BMI 26.20 kg/m   Wrist injury Patient reports that a couple days ago, she fell onto her left wrist/hand.  Reports that she fell onto an outstretched hand.  Since then, she has had some wrist pain primarily over central aspect of wrist and radial aspect of wrist.  Has had some associated swelling, stiffness, decreased range of motion.  Most notably, symptoms are worse with extension of wrist.  She denies any history of wrist injuries previously.  Has not had any numbness or tingling. On exam, she does have some tenderness to palpation over central wrist, tenderness to palpation along radial aspect, mild TTP in area of snuffbox.  There is slightly reduced range of motion for all planes, most notably with wrist extension.  Distal neurovascular exam is intact, cap refill normal. Discussed potential concerns/considerations in regards to wrist injury with location of pain, mechanism of injury.  Recommend proceeding with initial x-ray imaging to assess for potential fracture or underlying ligamentous injury. If x-rays are unremarkable, recommend proceeding with conservative measures including OTC medications, icing, consideration of wrist splinting/brace If abnormality observed on x-rays, plan to manage accordingly If x-rays are initially unremarkable, however symptoms persist beyond 3 to 4 weeks, would recommend repeat imaging as it is possible for occult fracture to not be observed on initial imaging, particularly related to scaphoid fracture.  Return if symptoms worsen or fail to improve.   ___________________________________________ Jayko Voorhees de , MD, ABFM, [facility] Primary Care and Sports Medicine [facility]

## 2022-04-21 NOTE — Assessment & Plan Note (Signed)
Patient reports that a couple days ago, she fell onto her left wrist/hand.  Reports that she fell onto an outstretched hand.  Since then, she has had some wrist pain primarily over central aspect of wrist and radial aspect of wrist.  Has had some associated swelling, stiffness, decreased range of motion.  Most notably, symptoms are worse with extension of wrist.  She denies any history of wrist injuries previously.  Has not had any numbness or tingling. On exam, she does have some tenderness to palpation over central wrist, tenderness to palpation along radial aspect, mild TTP in area of snuffbox.  There is slightly reduced range of motion for all planes, most notably with wrist extension.  Distal neurovascular exam is intact, cap refill normal. Discussed potential concerns/considerations in regards to wrist injury with location of pain, mechanism of injury.  Recommend proceeding with initial x-ray imaging to assess for potential fracture or underlying ligamentous injury. If x-rays are unremarkable, recommend proceeding with conservative measures including OTC medications, icing, consideration of wrist splinting/brace If abnormality observed on x-rays, plan to manage accordingly If x-rays are initially unremarkable, however symptoms persist beyond 3 to 4 weeks, would recommend repeat imaging as it is possible for occult fracture to not be observed on initial imaging, particularly related to scaphoid fracture.

## 2022-04-29 ENCOUNTER — Ambulatory Visit (HOSPITAL_BASED_OUTPATIENT_CLINIC_OR_DEPARTMENT_OTHER): Payer: Managed Care, Other (non HMO)

## 2022-04-29 DIAGNOSIS — Z23 Encounter for immunization: Secondary | ICD-10-CM | POA: Diagnosis not present

## 2022-05-08 ENCOUNTER — Other Ambulatory Visit (HOSPITAL_BASED_OUTPATIENT_CLINIC_OR_DEPARTMENT_OTHER): Payer: Self-pay | Admitting: Nurse Practitioner

## 2022-05-08 DIAGNOSIS — F900 Attention-deficit hyperactivity disorder, predominantly inattentive type: Secondary | ICD-10-CM

## 2022-05-11 ENCOUNTER — Encounter (HOSPITAL_BASED_OUTPATIENT_CLINIC_OR_DEPARTMENT_OTHER): Payer: Self-pay | Admitting: Nurse Practitioner

## 2022-05-13 ENCOUNTER — Other Ambulatory Visit (HOSPITAL_BASED_OUTPATIENT_CLINIC_OR_DEPARTMENT_OTHER): Payer: Self-pay | Admitting: Nurse Practitioner

## 2022-05-13 DIAGNOSIS — F900 Attention-deficit hyperactivity disorder, predominantly inattentive type: Secondary | ICD-10-CM

## 2022-05-13 MED ORDER — AMPHETAMINE-DEXTROAMPHETAMINE 20 MG PO TABS: 20.0000 mg | ORAL_TABLET | Freq: Every day | ORAL | 0 refills | Status: DC

## 2022-05-13 MED ORDER — AMPHETAMINE-DEXTROAMPHETAMINE 20 MG PO TABS: 20.0000 mg | ORAL_TABLET | Freq: Two times a day (BID) | ORAL | 0 refills | Status: DC

## 2022-05-13 NOTE — Telephone Encounter (Signed)
RX refilled through [facility] encounter.

## 2022-05-17 ENCOUNTER — Other Ambulatory Visit (HOSPITAL_BASED_OUTPATIENT_CLINIC_OR_DEPARTMENT_OTHER): Payer: Self-pay | Admitting: Nurse Practitioner

## 2022-05-17 DIAGNOSIS — F900 Attention-deficit hyperactivity disorder, predominantly inattentive type: Secondary | ICD-10-CM

## 2022-05-18 ENCOUNTER — Encounter (HOSPITAL_BASED_OUTPATIENT_CLINIC_OR_DEPARTMENT_OTHER): Payer: Self-pay | Admitting: Nurse Practitioner

## 2022-05-18 DIAGNOSIS — F900 Attention-deficit hyperactivity disorder, predominantly inattentive type: Secondary | ICD-10-CM

## 2022-05-20 MED ORDER — LISDEXAMFETAMINE DIMESYLATE 60 MG PO CAPS: 60.0000 mg | ORAL_CAPSULE | ORAL | 0 refills | Status: DC

## 2022-06-01 NOTE — Progress Notes (Signed)
Third HPV vaccine provided by [facility]. No reported concerns to provider.

## 2022-06-02 ENCOUNTER — Other Ambulatory Visit (HOSPITAL_BASED_OUTPATIENT_CLINIC_OR_DEPARTMENT_OTHER): Payer: Self-pay | Admitting: Nurse Practitioner

## 2022-06-02 DIAGNOSIS — F900 Attention-deficit hyperactivity disorder, predominantly inattentive type: Secondary | ICD-10-CM

## 2022-06-02 MED ORDER — AMPHETAMINE-DEXTROAMPHETAMINE 20 MG PO TABS: 20.0000 mg | ORAL_TABLET | Freq: Every day | ORAL | 0 refills | Status: DC

## 2022-06-02 MED ORDER — AMPHETAMINE-DEXTROAMPHET ER 20 MG PO CP24: 20.0000 mg | ORAL_CAPSULE | ORAL | 0 refills | Status: DC

## 2022-06-02 MED ORDER — AMPHETAMINE-DEXTROAMPHETAMINE 20 MG PO TABS: 20.0000 mg | ORAL_TABLET | Freq: Two times a day (BID) | ORAL | 0 refills | Status: DC

## 2022-06-04 ENCOUNTER — Other Ambulatory Visit (HOSPITAL_BASED_OUTPATIENT_CLINIC_OR_DEPARTMENT_OTHER): Payer: Self-pay | Admitting: Nurse Practitioner

## 2022-06-04 DIAGNOSIS — F900 Attention-deficit hyperactivity disorder, predominantly inattentive type: Secondary | ICD-10-CM

## 2022-06-09 MED ORDER — AMPHETAMINE-DEXTROAMPHETAMINE 20 MG PO TABS: 20.0000 mg | ORAL_TABLET | Freq: Two times a day (BID) | ORAL | 0 refills | Status: DC

## 2022-06-09 MED ORDER — METHYLPHENIDATE HCL ER (OSM) 54 MG PO TBCR: 54.0000 mg | EXTENDED_RELEASE_TABLET | ORAL | 0 refills | Status: DC

## 2022-06-16 ENCOUNTER — Other Ambulatory Visit (HOSPITAL_BASED_OUTPATIENT_CLINIC_OR_DEPARTMENT_OTHER): Payer: Self-pay | Admitting: Nurse Practitioner

## 2022-06-16 DIAGNOSIS — F900 Attention-deficit hyperactivity disorder, predominantly inattentive type: Secondary | ICD-10-CM

## 2022-06-16 MED ORDER — METHYLPHENIDATE HCL ER (OSM) 54 MG PO TBCR: 54.0000 mg | EXTENDED_RELEASE_TABLET | ORAL | 0 refills | Status: DC

## 2022-07-07 ENCOUNTER — Other Ambulatory Visit (HOSPITAL_BASED_OUTPATIENT_CLINIC_OR_DEPARTMENT_OTHER): Payer: Self-pay | Admitting: Nurse Practitioner

## 2022-07-07 DIAGNOSIS — F900 Attention-deficit hyperactivity disorder, predominantly inattentive type: Secondary | ICD-10-CM

## 2022-07-08 MED ORDER — LISDEXAMFETAMINE DIMESYLATE 60 MG PO CAPS: 60.0000 mg | ORAL_CAPSULE | ORAL | 0 refills | Status: DC

## 2022-07-08 MED ORDER — AMPHETAMINE-DEXTROAMPHETAMINE 20 MG PO TABS: 20.0000 mg | ORAL_TABLET | Freq: Two times a day (BID) | ORAL | 0 refills | Status: DC

## 2022-08-06 ENCOUNTER — Other Ambulatory Visit (HOSPITAL_BASED_OUTPATIENT_CLINIC_OR_DEPARTMENT_OTHER): Payer: Self-pay | Admitting: Nurse Practitioner

## 2022-08-06 DIAGNOSIS — F900 Attention-deficit hyperactivity disorder, predominantly inattentive type: Secondary | ICD-10-CM

## 2022-08-09 MED ORDER — AMPHETAMINE-DEXTROAMPHETAMINE 20 MG PO TABS: 20.0000 mg | ORAL_TABLET | Freq: Two times a day (BID) | ORAL | 0 refills | Status: DC

## 2022-08-10 ENCOUNTER — Encounter: Payer: Self-pay | Admitting: Nurse Practitioner

## 2022-08-10 ENCOUNTER — Encounter (HOSPITAL_BASED_OUTPATIENT_CLINIC_OR_DEPARTMENT_OTHER): Payer: Self-pay | Admitting: Advanced Practice Midwife

## 2022-08-10 DIAGNOSIS — R110 Nausea: Secondary | ICD-10-CM

## 2022-08-10 DIAGNOSIS — F900 Attention-deficit hyperactivity disorder, predominantly inattentive type: Secondary | ICD-10-CM

## 2022-08-11 MED ORDER — ONDANSETRON HCL 8 MG PO TABS: 8.0000 mg | ORAL_TABLET | Freq: Three times a day (TID) | ORAL | 5 refills | Status: DC | PRN

## 2022-08-12 ENCOUNTER — Other Ambulatory Visit (HOSPITAL_BASED_OUTPATIENT_CLINIC_OR_DEPARTMENT_OTHER): Payer: Self-pay | Admitting: Nurse Practitioner

## 2022-08-14 MED ORDER — LISDEXAMFETAMINE DIMESYLATE 60 MG PO CAPS: 60.0000 mg | ORAL_CAPSULE | ORAL | 0 refills | Status: DC

## 2022-09-01 MED ORDER — LISDEXAMFETAMINE DIMESYLATE 60 MG PO CAPS: 60.0000 mg | ORAL_CAPSULE | ORAL | 0 refills | Status: DC

## 2022-09-01 NOTE — Addendum Note (Signed)
Addended by: Egbert Seidel, Huntley Dec E on: 09/01/2022 08:32 PM   Modules accepted: Orders

## 2022-09-02 ENCOUNTER — Other Ambulatory Visit: Payer: Self-pay | Admitting: Nurse Practitioner

## 2022-09-03 NOTE — Addendum Note (Signed)
Addended by: Fareeha Evon, Huntley Dec E on: 09/03/2022 09:27 AM   Modules accepted: Orders

## 2022-09-05 ENCOUNTER — Other Ambulatory Visit (HOSPITAL_BASED_OUTPATIENT_CLINIC_OR_DEPARTMENT_OTHER): Payer: Self-pay | Admitting: Nurse Practitioner

## 2022-09-05 DIAGNOSIS — F900 Attention-deficit hyperactivity disorder, predominantly inattentive type: Secondary | ICD-10-CM

## 2022-09-09 MED ORDER — AMPHETAMINE-DEXTROAMPHETAMINE 20 MG PO TABS: 20.0000 mg | ORAL_TABLET | Freq: Two times a day (BID) | ORAL | 0 refills | Status: DC

## 2022-09-12 NOTE — Telephone Encounter (Signed)
Please reach out to patient to see if she has been able to get her ADHD medication. If not, we will need to contact the pharmacy to determine the issue. The [facility] [place], but was out of stock so [facility]. For some reason they were not filling the new Rx in [facility]. Thank you!

## 2022-10-07 ENCOUNTER — Other Ambulatory Visit (HOSPITAL_BASED_OUTPATIENT_CLINIC_OR_DEPARTMENT_OTHER): Payer: Self-pay | Admitting: Nurse Practitioner

## 2022-10-07 DIAGNOSIS — F900 Attention-deficit hyperactivity disorder, predominantly inattentive type: Secondary | ICD-10-CM

## 2022-10-08 MED ORDER — AMPHETAMINE-DEXTROAMPHETAMINE 20 MG PO TABS: 20.0000 mg | ORAL_TABLET | Freq: Two times a day (BID) | ORAL | 0 refills | Status: DC

## 2022-10-25 ENCOUNTER — Other Ambulatory Visit: Payer: Self-pay | Admitting: Nurse Practitioner

## 2022-10-25 DIAGNOSIS — F900 Attention-deficit hyperactivity disorder, predominantly inattentive type: Secondary | ICD-10-CM

## 2022-10-28 MED ORDER — LISDEXAMFETAMINE DIMESYLATE 60 MG PO CAPS: 60.0000 mg | ORAL_CAPSULE | ORAL | 0 refills | Status: DC

## 2022-10-29 ENCOUNTER — Encounter: Payer: Self-pay | Admitting: Nurse Practitioner

## 2022-10-29 DIAGNOSIS — F900 Attention-deficit hyperactivity disorder, predominantly inattentive type: Secondary | ICD-10-CM

## 2022-10-29 MED ORDER — LISDEXAMFETAMINE DIMESYLATE 30 MG PO CHEW: 60.0000 mg | CHEWABLE_TABLET | Freq: Every day | ORAL | 0 refills | Status: DC

## 2022-11-05 MED ORDER — AMPHETAMINE-DEXTROAMPHETAMINE 20 MG PO TABS: 20.0000 mg | ORAL_TABLET | Freq: Every day | ORAL | 0 refills | Status: DC

## 2022-11-05 NOTE — Addendum Note (Signed)
Addended by: Payton Prinsen, Clarise Cruz E on: 11/05/2022 02:16 PM   Modules accepted: Orders

## 2022-11-07 ENCOUNTER — Other Ambulatory Visit (HOSPITAL_BASED_OUTPATIENT_CLINIC_OR_DEPARTMENT_OTHER): Payer: Self-pay

## 2022-11-07 ENCOUNTER — Other Ambulatory Visit: Payer: Self-pay | Admitting: Nurse Practitioner

## 2022-11-07 ENCOUNTER — Encounter: Payer: Self-pay | Admitting: Nurse Practitioner

## 2022-11-07 DIAGNOSIS — F900 Attention-deficit hyperactivity disorder, predominantly inattentive type: Secondary | ICD-10-CM

## 2022-11-11 ENCOUNTER — Other Ambulatory Visit (HOSPITAL_BASED_OUTPATIENT_CLINIC_OR_DEPARTMENT_OTHER): Payer: Self-pay

## 2022-11-11 MED ORDER — AMPHETAMINE-DEXTROAMPHETAMINE 20 MG PO TABS
20.0000 mg | ORAL_TABLET | Freq: Two times a day (BID) | ORAL | 0 refills | Status: DC
  Filled 2023-01-11: qty 60, 30d supply, fill #0

## 2022-11-11 MED ORDER — AMPHETAMINE-DEXTROAMPHETAMINE 20 MG PO TABS
20.0000 mg | ORAL_TABLET | Freq: Two times a day (BID) | ORAL | 0 refills | Status: DC
  Filled 2022-12-11: qty 60, 30d supply, fill #0

## 2022-11-11 MED ORDER — AMPHETAMINE-DEXTROAMPHETAMINE 20 MG PO TABS
20.0000 mg | ORAL_TABLET | Freq: Two times a day (BID) | ORAL | 0 refills | Status: DC
  Filled 2022-11-11: qty 60, 30d supply, fill #0

## 2022-11-11 MED ORDER — LISDEXAMFETAMINE DIMESYLATE 30 MG PO CHEW
60.0000 mg | CHEWABLE_TABLET | Freq: Every day | ORAL | 0 refills | Status: DC
  Filled 2022-11-11 – 2022-11-28 (×3): qty 60, 30d supply, fill #0

## 2022-11-12 ENCOUNTER — Other Ambulatory Visit (HOSPITAL_BASED_OUTPATIENT_CLINIC_OR_DEPARTMENT_OTHER): Payer: Self-pay

## 2022-11-23 ENCOUNTER — Other Ambulatory Visit (HOSPITAL_BASED_OUTPATIENT_CLINIC_OR_DEPARTMENT_OTHER): Payer: Self-pay

## 2022-11-23 ENCOUNTER — Encounter (HOSPITAL_BASED_OUTPATIENT_CLINIC_OR_DEPARTMENT_OTHER): Payer: Self-pay | Admitting: Pharmacist

## 2022-11-24 ENCOUNTER — Encounter: Payer: Self-pay | Admitting: Nurse Practitioner

## 2022-11-28 ENCOUNTER — Other Ambulatory Visit (HOSPITAL_BASED_OUTPATIENT_CLINIC_OR_DEPARTMENT_OTHER): Payer: Self-pay

## 2022-11-28 ENCOUNTER — Other Ambulatory Visit: Payer: Self-pay | Admitting: Nurse Practitioner

## 2022-11-28 DIAGNOSIS — F900 Attention-deficit hyperactivity disorder, predominantly inattentive type: Secondary | ICD-10-CM

## 2022-11-28 MED ORDER — LISDEXAMFETAMINE DIMESYLATE 60 MG PO CHEW: 60.0000 mg | CHEWABLE_TABLET | Freq: Every day | ORAL | 0 refills | Status: DC

## 2022-11-28 MED ORDER — LISDEXAMFETAMINE DIMESYLATE 60 MG PO CHEW
60.0000 mg | CHEWABLE_TABLET | Freq: Every day | ORAL | 0 refills | Status: DC
  Filled 2022-11-28: qty 30, 30d supply, fill #0

## 2022-11-28 MED ORDER — LISDEXAMFETAMINE DIMESYLATE 60 MG PO CHEW
60.0000 mg | CHEWABLE_TABLET | Freq: Every day | ORAL | 0 refills | Status: DC
  Filled 2022-12-29: qty 30, 30d supply, fill #0

## 2022-12-11 ENCOUNTER — Other Ambulatory Visit (HOSPITAL_BASED_OUTPATIENT_CLINIC_OR_DEPARTMENT_OTHER): Payer: Self-pay

## 2022-12-11 ENCOUNTER — Other Ambulatory Visit: Payer: Self-pay

## 2022-12-15 ENCOUNTER — Ambulatory Visit
Admission: RE | Admit: 2022-12-15 | Discharge: 2022-12-15 | Disposition: A | Discharge status: Home / Self Care | Payer: Managed Care, Other (non HMO) | Source: Ambulatory Visit | Attending: Nurse Practitioner

## 2022-12-15 ENCOUNTER — Ambulatory Visit (INDEPENDENT_AMBULATORY_CARE_PROVIDER_SITE_OTHER): Payer: Managed Care, Other (non HMO) | Admitting: Nurse Practitioner

## 2022-12-15 VITALS — BP 120/70 | HR 111 | Wt 159.4 lb

## 2022-12-15 DIAGNOSIS — R239 Unspecified skin changes: Secondary | ICD-10-CM

## 2022-12-15 DIAGNOSIS — M79645 Pain in left finger(s): Secondary | ICD-10-CM | POA: Diagnosis not present

## 2022-12-15 DIAGNOSIS — R5383 Other fatigue: Secondary | ICD-10-CM

## 2022-12-15 DIAGNOSIS — R051 Acute cough: Secondary | ICD-10-CM

## 2022-12-15 DIAGNOSIS — R251 Tremor, unspecified: Secondary | ICD-10-CM | POA: Insufficient documentation

## 2022-12-15 HISTORY — DX: Unspecified skin changes: R23.9

## 2022-12-15 MED ORDER — PREDNISONE 20 MG PO TABS: 20.0000 mg | ORAL_TABLET | Freq: Every day | ORAL | 0 refills | Status: DC

## 2022-12-15 MED ORDER — PROMETHAZINE-DM 6.25-15 MG/5ML PO SYRP: 5.0000 mL | ORAL_SOLUTION | Freq: Four times a day (QID) | ORAL | 0 refills | Status: DC | PRN

## 2022-12-15 NOTE — Progress Notes (Unsigned)
Orma Render, DNP, AGNP-c [place] [address] [place], [place] [address] [phone]  Subjective:   Jessica Graham is a 37 y.o. female presents to day for evaluation of:  [name] presents today with chief complaints of feeling run down and fatigued for the past few days. She has recently had a cold and is uncertain whether her current symptoms are a continuation of the cold or indicative of a separate issue. Currently, the patient is dealing with a persistent cough that disrupts her sleep and is contending with allergy symptoms. She has been managing these symptoms with Tessalon Perles, an inhaler, cetirizine, and Mucinex.  Additionally, the patient is experiencing difficulty with weight loss despite maintaining an active lifestyle and a healthy diet.  She reports a specific pain in her thumb, particularly at the nail bed, which has persisted for several months. The pain intensifies when her hands are exposed to cold temperatures and occasionally escalates to a throbbing sensation without provocation. However, the patient denies any discoloration in her fingertips and states that no other fingers are affected.   The patient has expressed concerns regarding several skin lesions. One lesion on her face has undergone changes over time, and to others, one on her right thigh and the other on her left shoulder originated from a wound that healed slowly and has since become hard. Despite her frequent sun exposure, she is conscientious about applying sunscreen and wearing protective hats.  Furthermore, the patient describes a chronic issue with shakiness, especially noticeable when extending her hands. She notes that the shakiness does not seem to impact her writing as long as her hand is supported. She is unsure whether this condition is within the range of normal.  The patient also mentions issues with her menstrual cycle. Despite taking birth control to prevent menstruation, she  continues to experience bleeding, leaving her uncertain whether this represents breakthrough bleeding or a regular period.    PMH, Medications, and Allergies reviewed and updated in chart as appropriate.   ROS negative except for what is listed in HPI. Objective:  BP 120/70   Pulse (!) 111   Wt 159 lb 6.4 oz (72.3 kg)   BMI 25.73 kg/m  Physical Exam Vitals and nursing note reviewed.  Constitutional:      Appearance: Normal appearance. She is obese.  HENT:     Head: Normocephalic.  Eyes:     Pupils: Pupils are equal, round, and reactive to light.  Cardiovascular:     Rate and Rhythm: Regular rhythm. Tachycardia present.     Pulses: Normal pulses.     Heart sounds: Normal heart sounds.  Pulmonary:     Effort: Pulmonary effort is normal.     Breath sounds: Normal breath sounds. No wheezing.     Comments: Frequent dry coughing spells throughout the visit.  Abdominal:     General: Bowel sounds are normal.     Palpations: Abdomen is soft.  Musculoskeletal:        General: Normal range of motion.     Cervical back: Normal range of motion.     Right lower leg: No edema.     Left lower leg: No edema.  Lymphadenopathy:     Cervical: No cervical adenopathy.  Skin:    General: Skin is warm and dry.     Capillary Refill: Capillary refill takes less than 2 seconds.  Neurological:     General: No focal deficit present.     Mental Status: She is alert and  oriented to person, place, and time.     Sensory: No sensory deficit.     Motor: Tremor present. No weakness.     Coordination: Coordination normal.     Gait: Gait normal.  Psychiatric:        Mood and Affect: Mood normal.        Assessment & Plan:   Problem List Items Addressed This Visit   None     Orma Render, DNP, AGNP-c 12/15/2022  9:08 AM    History, Medications, Surgery, SDOH, and Family History reviewed and updated as appropriate.

## 2022-12-15 NOTE — Assessment & Plan Note (Signed)
Jessica Graham is experiencing fatigue and has had a recent cold. She is not sure if the onset of fatigue is related to the cold or other factors. She tells me in general she just feels rundown.  Plan: - Evaluate for potential viral infection or other underlying causes. - Will obtain labs today for monitoring and further evaluation.  - Monitor the patient's symptoms and track improvement over time.

## 2022-12-15 NOTE — Assessment & Plan Note (Signed)
Tremors of hands bilaterally with no clear etiology. These are significant with hand outstretched, but seem to improve with movement which is reassuring. We discussed that this could be a benign finding, but will obtain labs today for further evaluation.  Plan: - Labs today - If labs are unrevealing, I do suggest that we look into a neurology evaluation just to make sure this is not a concern.

## 2022-12-15 NOTE — Assessment & Plan Note (Signed)
Jessica Graham is experiencing pain in her left thumb at the area of the nail bed. There is no evidence of injury present, however, on close examination the nail bed does appear to have slight elevation in the area proximal to the cuticle. Plan: - Obtain x-ray of the thumb to rule out bony abnormality or growth present.

## 2022-12-15 NOTE — Patient Instructions (Signed)
I want to check a few labs today to see if we can determine the causes of the tremors and you feeling run down.   I have sent in promethazine-DM for the couhg. This should help at night, but can make you sleepy so use caution during the day.  I have sent in the prednisone for you. If this is not better in the next couple of days then let me know.   I have sent the order for thumb x-ray to make sure there is nothing going on with the bone that is causing the pain you are experiencing.  You can walk in to have this done at [facility] [place].

## 2022-12-15 NOTE — Assessment & Plan Note (Signed)
Many has noticed changes to a macule on her right cheek with enlargement and changes in coloration. She also has two areas that were initially open wounds that took a particularly long time to heal but have since healed and are nodular in appearance and texture.  Plan: - Will send referral to dermatology for further evaluation.

## 2022-12-16 LAB — CBC WITH DIFFERENTIAL/PLATELET
Basophils Absolute: 0.1 10*3/uL (ref 0.0–0.2)
Basos: 1 %
EOS (ABSOLUTE): 0.3 10*3/uL (ref 0.0–0.4)
Eos: 3 %
Hematocrit: 38.7 % (ref 34.0–46.6)
Hemoglobin: 12.5 g/dL (ref 11.1–15.9)
Immature Grans (Abs): 0 10*3/uL (ref 0.0–0.1)
Immature Granulocytes: 0 %
Lymphocytes Absolute: 2.6 10*3/uL (ref 0.7–3.1)
Lymphs: 23 %
MCH: 30 pg (ref 26.6–33.0)
MCHC: 32.3 g/dL (ref 31.5–35.7)
MCV: 93 fL (ref 79–97)
Monocytes Absolute: 0.8 10*3/uL (ref 0.1–0.9)
Monocytes: 7 %
Neutrophils Absolute: 7.8 10*3/uL — ABNORMAL HIGH (ref 1.4–7.0)
Neutrophils: 66 %
Platelets: 373 10*3/uL (ref 150–450)
RBC: 4.17 x10E6/uL (ref 3.77–5.28)
RDW: 11.7 % (ref 11.7–15.4)
WBC: 11.6 10*3/uL — ABNORMAL HIGH (ref 3.4–10.8)

## 2022-12-16 LAB — IRON,TIBC AND FERRITIN PANEL
Ferritin: 46 ng/mL (ref 15–150)
Iron Saturation: 16 % (ref 15–55)
Iron: 55 ug/dL (ref 27–159)
Total Iron Binding Capacity: 352 ug/dL (ref 250–450)
UIBC: 297 ug/dL (ref 131–425)

## 2022-12-16 LAB — HEMOGLOBIN A1C
Est. average glucose Bld gHb Est-mCnc: 114 mg/dL
Hgb A1c MFr Bld: 5.6 % (ref 4.8–5.6)

## 2022-12-16 LAB — COMPREHENSIVE METABOLIC PANEL
ALT: 39 IU/L — ABNORMAL HIGH (ref 0–32)
AST: 30 IU/L (ref 0–40)
Albumin/Globulin Ratio: 1.8 (ref 1.2–2.2)
Albumin: 4.6 g/dL (ref 3.9–4.9)
Alkaline Phosphatase: 118 IU/L (ref 44–121)
BUN/Creatinine Ratio: 12 (ref 9–23)
BUN: 9 mg/dL (ref 6–20)
Bilirubin Total: <0.2 mg/dL (ref 0.0–1.2)
CO2: 22 mmol/L (ref 20–29)
Calcium: 9.7 mg/dL (ref 8.7–10.2)
Chloride: 102 mmol/L (ref 96–106)
Creatinine, Ser: 0.74 mg/dL (ref 0.57–1.00)
Globulin, Total: 2.6 g/dL (ref 1.5–4.5)
Glucose: 89 mg/dL (ref 70–99)
Potassium: 4.7 mmol/L (ref 3.5–5.2)
Sodium: 139 mmol/L (ref 134–144)
Total Protein: 7.2 g/dL (ref 6.0–8.5)
eGFR: 109 mL/min/{1.73_m2} (ref >59)

## 2022-12-16 LAB — VITAMIN D 25 HYDROXY (VIT D DEFICIENCY, FRACTURES): Vit D, 25-Hydroxy: 39.2 ng/mL (ref 30.0–100.0)

## 2022-12-16 LAB — VITAMIN B12: Vitamin B-12: 818 pg/mL (ref 232–1245)

## 2022-12-16 LAB — TSH: TSH: 1.63 u[IU]/mL (ref 0.450–4.500)

## 2022-12-17 ENCOUNTER — Encounter: Payer: Self-pay | Admitting: Nurse Practitioner

## 2022-12-29 ENCOUNTER — Other Ambulatory Visit (HOSPITAL_BASED_OUTPATIENT_CLINIC_OR_DEPARTMENT_OTHER): Payer: Self-pay

## 2022-12-30 ENCOUNTER — Other Ambulatory Visit: Payer: Self-pay

## 2023-01-06 ENCOUNTER — Other Ambulatory Visit (HOSPITAL_BASED_OUTPATIENT_CLINIC_OR_DEPARTMENT_OTHER): Payer: Self-pay

## 2023-01-06 ENCOUNTER — Encounter: Payer: Self-pay | Admitting: Nurse Practitioner

## 2023-01-06 ENCOUNTER — Other Ambulatory Visit: Payer: Self-pay

## 2023-01-06 DIAGNOSIS — F900 Attention-deficit hyperactivity disorder, predominantly inattentive type: Secondary | ICD-10-CM

## 2023-01-06 DIAGNOSIS — R053 Chronic cough: Secondary | ICD-10-CM

## 2023-01-07 ENCOUNTER — Other Ambulatory Visit (HOSPITAL_BASED_OUTPATIENT_CLINIC_OR_DEPARTMENT_OTHER): Payer: Self-pay

## 2023-01-11 ENCOUNTER — Other Ambulatory Visit (HOSPITAL_BASED_OUTPATIENT_CLINIC_OR_DEPARTMENT_OTHER): Payer: Self-pay

## 2023-01-11 MED ORDER — LISDEXAMFETAMINE DIMESYLATE 70 MG PO CAPS
70.0000 mg | ORAL_CAPSULE | Freq: Every day | ORAL | 0 refills | Status: DC
Start: 2023-03-08 — End: 2023-02-11

## 2023-01-11 MED ORDER — LISDEXAMFETAMINE DIMESYLATE 70 MG PO CAPS
70.0000 mg | ORAL_CAPSULE | Freq: Every day | ORAL | 0 refills | Status: DC
Start: 2023-01-11 — End: 2023-01-30
  Filled 2023-01-11: qty 30, 30d supply, fill #0

## 2023-01-11 MED ORDER — LISDEXAMFETAMINE DIMESYLATE 70 MG PO CAPS
70.0000 mg | ORAL_CAPSULE | Freq: Every day | ORAL | 0 refills | Status: DC
Start: 2023-02-08 — End: 2023-02-11
  Filled 2023-02-10: qty 30, 30d supply, fill #0
  Filled ????-??-??: fill #0

## 2023-01-12 ENCOUNTER — Other Ambulatory Visit (HOSPITAL_BASED_OUTPATIENT_CLINIC_OR_DEPARTMENT_OTHER): Payer: Self-pay

## 2023-01-15 ENCOUNTER — Other Ambulatory Visit: Payer: Self-pay

## 2023-01-25 ENCOUNTER — Other Ambulatory Visit (HOSPITAL_BASED_OUTPATIENT_CLINIC_OR_DEPARTMENT_OTHER): Payer: Self-pay | Admitting: Pulmonary Disease

## 2023-01-25 DIAGNOSIS — R053 Chronic cough: Secondary | ICD-10-CM

## 2023-01-30 ENCOUNTER — Ambulatory Visit (INDEPENDENT_AMBULATORY_CARE_PROVIDER_SITE_OTHER): Payer: Managed Care, Other (non HMO) | Admitting: Nurse Practitioner

## 2023-01-30 ENCOUNTER — Encounter: Payer: Self-pay | Admitting: Nurse Practitioner

## 2023-01-30 VITALS — BP 122/80 | HR 124 | Wt 160.6 lb

## 2023-01-30 DIAGNOSIS — R251 Tremor, unspecified: Secondary | ICD-10-CM

## 2023-01-30 DIAGNOSIS — M79645 Pain in left finger(s): Secondary | ICD-10-CM

## 2023-01-30 DIAGNOSIS — R6889 Other general symptoms and signs: Secondary | ICD-10-CM | POA: Diagnosis not present

## 2023-01-30 DIAGNOSIS — D72829 Elevated white blood cell count, unspecified: Secondary | ICD-10-CM

## 2023-01-30 DIAGNOSIS — R000 Tachycardia, unspecified: Secondary | ICD-10-CM

## 2023-01-30 DIAGNOSIS — R5382 Chronic fatigue, unspecified: Secondary | ICD-10-CM | POA: Diagnosis not present

## 2023-01-30 DIAGNOSIS — F339 Major depressive disorder, recurrent, unspecified: Secondary | ICD-10-CM

## 2023-01-30 DIAGNOSIS — F900 Attention-deficit hyperactivity disorder, predominantly inattentive type: Secondary | ICD-10-CM | POA: Diagnosis not present

## 2023-01-30 HISTORY — DX: Other general symptoms and signs: R68.89

## 2023-01-30 MED ORDER — BUPROPION HCL ER (XL) 150 MG PO TB24
150.0000 mg | ORAL_TABLET | Freq: Every day | ORAL | 2 refills | Status: DC
Start: 2023-01-30 — End: 2023-03-24

## 2023-01-30 MED ORDER — LISDEXAMFETAMINE DIMESYLATE 70 MG PO CAPS
70.0000 mg | ORAL_CAPSULE | Freq: Every day | ORAL | 0 refills | Status: DC
Start: 2023-04-03 — End: 2023-02-11

## 2023-01-30 MED ORDER — AMPHETAMINE-DEXTROAMPHETAMINE 20 MG PO TABS: 20.0000 mg | ORAL_TABLET | Freq: Two times a day (BID) | ORAL | 0 refills | Status: DC

## 2023-01-30 MED ORDER — AMPHETAMINE-DEXTROAMPHETAMINE 20 MG PO TABS
20.0000 mg | ORAL_TABLET | Freq: Two times a day (BID) | ORAL | 0 refills | Status: DC
Start: 2023-02-27 — End: 2023-02-11

## 2023-01-30 MED ORDER — AMPHETAMINE-DEXTROAMPHETAMINE 20 MG PO TABS
20.0000 mg | ORAL_TABLET | Freq: Two times a day (BID) | ORAL | 0 refills | Status: DC
Start: 2023-03-27 — End: 2023-02-11

## 2023-01-30 NOTE — Patient Instructions (Signed)
Bupropion Extended-Release Tablets (Depression/Mood Disorders) What is this medication? BUPROPION (byoo PROE pee on) treats depression. It increases norepinephrine and dopamine in the brain, hormones that help regulate mood. It belongs to a group of medications called NDRIs. This medicine may be used for other purposes; ask your health care provider or pharmacist if you have questions. COMMON BRAND NAME(S): Aplenzin, Budeprion XL, Forfivo XL, Wellbutrin XL What should I tell my care team before I take this medication? They need to know if you have any of these conditions: An eating disorder, such as anorexia or bulimia Bipolar disorder or psychosis Diabetes or high blood sugar, treated with medication Glaucoma Head injury or brain tumor Heart disease, previous heart attack, or irregular heart beat High blood pressure Kidney or liver disease Seizures (convulsions) Suicidal thoughts or a previous suicide attempt Tourette's syndrome Weight loss An unusual or allergic reaction to bupropion, other medications, foods, dyes, or preservatives Pregnant or trying to become pregnant Breast-feeding How should I use this medication? Take this medication by mouth with a glass of water. Follow the directions on the prescription label. You can take it with or without food. If it upsets your stomach, take it with food. Do not crush, chew, or cut these tablets. This medication is taken once daily at the same time each day. Do not take your medication more often than directed. Do not stop taking this medication suddenly except upon the advice of your care team. Stopping this medication too quickly may cause serious side effects or your condition may worsen. A special MedGuide will be given to you by the pharmacist with each prescription and refill. Be sure to read this information carefully each time. Talk to your care team about the use of this medication in children. Special care may be needed. Overdosage:  If you think you have taken too much of this medicine contact a poison control center or emergency room at once. NOTE: This medicine is only for you. Do not share this medicine with others. What if I miss a dose? If you miss a dose, skip the missed dose and take your next tablet at the regular time. Do not take double or extra doses. What may interact with this medication? Do not take this medication with any of the following: Linezolid MAOIs like Azilect, Carbex, Eldepryl, Marplan, Nardil, and Parnate Methylene blue (injected into a vein) Other medications that contain bupropion like Zyban This medication may also interact with the following: Alcohol Certain medications for anxiety or sleep Certain medications for blood pressure like metoprolol, propranolol Certain medications for depression or psychotic disturbances Certain medications for HIV or AIDS like efavirenz, lopinavir, nelfinavir, ritonavir Certain medications for irregular heart beat like propafenone, flecainide Certain medications for Parkinson's disease like amantadine, levodopa Certain medications for seizures like carbamazepine, phenytoin, phenobarbital Cimetidine Clopidogrel Cyclophosphamide Digoxin Furazolidone Isoniazid Nicotine Orphenadrine Procarbazine Steroid medications like prednisone or cortisone Stimulant medications for attention disorders, weight loss, or to stay awake Tamoxifen Theophylline Thiotepa Ticlopidine Tramadol Warfarin This list may not describe all possible interactions. Give your health care provider a list of all the medicines, herbs, non-prescription drugs, or dietary supplements you use. Also tell them if you smoke, drink alcohol, or use illegal drugs. Some items may interact with your medicine. What should I watch for while using this medication? Tell your care team if your symptoms do not get better or if they get worse. Visit your care team for regular checks on your progress.  Because it may take several weeks   to see the full effects of this medication, it is important to continue your treatment as prescribed. Watch for new or worsening thoughts of suicide or depression. This includes sudden changes in mood, behavior, or thoughts. These changes can happen at any time but are more common in the beginning of treatment or after a change in dose. Call your care team right away if you experience these thoughts or worsening depression. Manic episodes may happen in patients with bipolar disorder who take this medication. Watch for changes in feelings or behaviors such as feeling anxious, nervous, agitated, panicky, irritable, hostile, aggressive, impulsive, severely restless, overly excited and hyperactive, or trouble sleeping. These symptoms can happen at anytime but are more common in the beginning of treatment or after a change in dose. Call your care team right away if you notice any of these symptoms. This medication may cause serious skin reactions. They can happen weeks to months after starting the medication. Contact your care team right away if you notice fevers or flu-like symptoms with a rash. The rash may be red or purple and then turn into blisters or peeling of the skin. Or, you might notice a red rash with swelling of the face, lips or lymph nodes in your neck or under your arms. Avoid drinks that contain alcohol while taking this medication. Drinking large amounts of alcohol, using sleeping or anxiety medications, or quickly stopping the use of these agents while taking this medication may increase your risk for a seizure. Do not drive or use heavy machinery until you know how this medication affects you. This medication can impair your ability to perform these tasks. Do not take this medication close to bedtime. It may prevent you from sleeping. Your mouth may get dry. Chewing sugarless gum or sucking hard candy, and drinking plenty of water may help. Contact your care  team if the problem does not go away or is severe. The tablet shell for some brands of this medication does not dissolve. This is normal. The tablet shell may appear whole in the stool. This is not a cause for concern. What side effects may I notice from receiving this medication? Side effects that you should report to your care team as soon as possible: Allergic reactions--skin rash, itching, hives, swelling of the face, lips, tongue, or throat Increase in blood pressure Mood and behavior changes--anxiety, nervousness, confusion, hallucinations, irritability, hostility, thoughts of suicide or self-harm, worsening mood, feelings of depression Redness, blistering, peeling, or loosening of the skin, including inside the mouth Seizures Sudden eye pain or change in vision such as blurry vision, seeing halos around lights, vision loss Side effects that usually do not require medical attention (report to your care team if they continue or are bothersome): Constipation Dizziness Dry mouth Loss of appetite Nausea Tremors or shaking Trouble sleeping This list may not describe all possible side effects. Call your doctor for medical advice about side effects. You may report side effects to FDA at 1-800-FDA-1088. Where should I keep my medication? Keep out of the reach of children and pets. Store at room temperature between 15 and 30 degrees C (59 and 86 degrees F). Throw away any unused medication after the expiration date. NOTE: This sheet is a summary. It may not cover all possible information. If you have questions about this medicine, talk to your doctor, pharmacist, or health care provider.  2023 Elsevier/Gold Standard (2004-10-02 00:00:00)  

## 2023-01-30 NOTE — Progress Notes (Signed)
[name], DNP, AGNP-c [facility] [address] [place], [place] [address] [phone]  Subjective:   Jessica Graham is a 37 y.o. female presents to day for evaluation of: Lack of Motivation, Fatigue, and "feeling off" And reports persistently high heart rate, hair loss, and significant fatigue as chronic and progressive symptoms. She reports adequate sleep of 8 hours per night, though she still feels the need for a nap during the day.  She is currently taking B12 and Adderall, which she administers in the morning and afternoon.  Zarya has a history of depression, which she mentions does not typically manifest as sadness for her.  She expresses dissatisfaction with her previous experience on Zoloft, noting severe night sweats and discomfort this disrupt her sleep.  She also feels a lack of energy, which affects her ability to engage with her child and diminishes her anticipation of future events, such as vacations.  She describes variable appetite and feelings of guilt about letting her family down.  And experiences constant trouble with concentration and fluctuating energy levels, which she attributes to her mental health.  She denies any current suicidal ideation.  She recently had labs completed which were relatively normal with exception of elevation in her white blood cell counts thought to be due to an infection.  In additionally mentions a peculiar issue with her thumb, which she experiences intermittent, severe pain that does not correlate with any visible abnormalities on x-ray.    PMH, Medications, and Allergies reviewed and updated in chart as appropriate.   ROS negative except for what is listed in HPI. Objective:  BP 122/80   Pulse (!) 124   Wt 160 lb 9.6 oz (72.8 kg)   BMI 25.92 kg/m  Physical Exam Vitals and nursing note reviewed.  Constitutional:      Appearance: Normal appearance.  HENT:     Head: Normocephalic.  Eyes:     Pupils: Pupils are  equal, round, and reactive to light.  Neck:     Vascular: No carotid bruit.  Cardiovascular:     Rate and Rhythm: Regular rhythm. Tachycardia present.     Pulses: Normal pulses.     Heart sounds: Normal heart sounds.  Pulmonary:     Effort: Pulmonary effort is normal.     Breath sounds: Normal breath sounds.  Musculoskeletal:        General: Normal range of motion.     Cervical back: Normal range of motion.  Skin:    General: Skin is warm.  Neurological:     General: No focal deficit present.     Mental Status: She is alert and oriented to person, place, and time.     Motor: Tremor present. No weakness.     Coordination: Coordination normal.     Comments: Chronic tremor present of hands bilaterally.   Psychiatric:        Mood and Affect: Mood normal.           Assessment & Plan:   Problem List Items Addressed This Visit     Attention deficit hyperactivity disorder, predominantly inattentive type    Jolei has a longstanding history of attention deficit disorder with difficulty in management.  She is currently on Vyvanse and Adderall with dosing 3 times daily.  She has been on these medications for many years.  It is possible that these medications are contributing to the tachycardia that she has been experiencing given that they are stimulant medications.  However, due to the prolonged  use of the medication and consistent dosing this is less likely.  At this time we are unable to increase the doses of her medication as she is on maximum doses however improved control may be established with the addition of Wellbutrin which would also help with her mood symptoms. Plan: Medication refills for Vyvanse and Adderall as needed for the next 6 months. Will monitor patient's response to Wellbutrin and ADD and depression symptoms. Plan      Relevant Medications   amphetamine-dextroamphetamine (ADDERALL) 20 MG tablet   Tremors of nervous system    Ongoing tremors of the hands  bilaterally with no known etiology.  Suspect this is a benign finding but will continue to monitor closely.      Pain of left thumb    Recent evaluation of the thumb via x-ray was unrevealing of any concerning finding.  There is a slight delay discoloration noted at the cuticle area with no other associated symptoms.  At this time the cause of the symptoms are unclear.  I recommend monitoring for now.  I will be happy to send a referral to orthopedics if the patient is interested at any point.      Depression, recurrent (HCC)    And is experiencing symptoms of ongoing fatigue despite adequate sleep and use of B12 and ADHD medication.  She does have a history of depression and scored a 12 on the depression screening questionnaire, indicating depressive symptoms are present at this time.  She is not currently on any medication for this.  Given her history of ADHD joint decision was made to trial Wellbutrin to see if this is helpful for management of her symptoms. Plan: Start Wellbutrin 150 mg extended release once daily in the morning. Reassess response to medication in a couple of weeks and adjust dose if necessary. Encourage patient to continue therapy sessions. Report any adverse side effects or concerns immediately.      Relevant Medications   buPROPion (WELLBUTRIN XL) 150 MG 24 hr tablet   Leukocytosis   Tachycardia, unspecified    Persistent tachycardia noted with no alarm symptoms present.  Patient is on multiple doses of stimulant medication which could be contributing.  Also consider that this could be the patient's baseline.  We will plan to continue to monitor and reevaluate if symptoms appear to worsen or new concerns present.      Poor motivation   Relevant Medications   buPROPion (WELLBUTRIN XL) 150 MG 24 hr tablet   Fatigue - Primary      [name], DNP, AGNP-c 02/23/2023  6:27 PM    History, Medications, Surgery, SDOH, and Family History reviewed and updated as  appropriate.

## 2023-02-09 ENCOUNTER — Other Ambulatory Visit: Payer: Self-pay | Admitting: Nurse Practitioner

## 2023-02-09 DIAGNOSIS — F900 Attention-deficit hyperactivity disorder, predominantly inattentive type: Secondary | ICD-10-CM

## 2023-02-10 ENCOUNTER — Other Ambulatory Visit (HOSPITAL_BASED_OUTPATIENT_CLINIC_OR_DEPARTMENT_OTHER): Payer: Self-pay

## 2023-02-10 ENCOUNTER — Encounter: Payer: Self-pay | Admitting: Nurse Practitioner

## 2023-02-10 ENCOUNTER — Encounter (HOSPITAL_BASED_OUTPATIENT_CLINIC_OR_DEPARTMENT_OTHER): Payer: Self-pay | Admitting: Pharmacist

## 2023-02-10 DIAGNOSIS — F900 Attention-deficit hyperactivity disorder, predominantly inattentive type: Secondary | ICD-10-CM

## 2023-02-10 NOTE — Telephone Encounter (Signed)
Refill request last apt 01/30/23

## 2023-02-11 ENCOUNTER — Other Ambulatory Visit (HOSPITAL_BASED_OUTPATIENT_CLINIC_OR_DEPARTMENT_OTHER): Payer: Self-pay

## 2023-02-11 ENCOUNTER — Other Ambulatory Visit: Payer: Self-pay

## 2023-02-11 MED ORDER — LISDEXAMFETAMINE DIMESYLATE 70 MG PO CAPS
70.0000 mg | ORAL_CAPSULE | Freq: Every day | ORAL | 0 refills | Status: DC
Start: 2023-03-08 — End: 2023-04-10
  Filled 2023-03-12 – 2023-03-14 (×2): qty 30, 30d supply, fill #0

## 2023-02-11 MED ORDER — AMPHETAMINE-DEXTROAMPHETAMINE 20 MG PO TABS
20.0000 mg | ORAL_TABLET | Freq: Two times a day (BID) | ORAL | 0 refills | Status: DC
Start: 2023-03-11 — End: 2023-12-11
  Filled 2023-03-12: qty 60, 30d supply, fill #0

## 2023-02-11 MED ORDER — LISDEXAMFETAMINE DIMESYLATE 70 MG PO CAPS
70.0000 mg | ORAL_CAPSULE | Freq: Every day | ORAL | 0 refills | Status: DC
Start: 2023-04-03 — End: 2023-06-30

## 2023-02-11 MED ORDER — LISDEXAMFETAMINE DIMESYLATE 70 MG PO CAPS
70.0000 mg | ORAL_CAPSULE | Freq: Every day | ORAL | 0 refills | Status: DC
Start: 2023-02-11 — End: 2023-02-13
  Filled 2023-02-11 – 2023-02-13 (×2): qty 30, 30d supply, fill #0

## 2023-02-11 MED ORDER — AMPHETAMINE-DEXTROAMPHETAMINE 20 MG PO TABS
20.0000 mg | ORAL_TABLET | Freq: Two times a day (BID) | ORAL | 0 refills | Status: DC
Start: 2023-04-08 — End: 2023-12-11
  Filled 2023-07-13: qty 60, 30d supply, fill #0

## 2023-02-11 MED ORDER — AMPHETAMINE-DEXTROAMPHETAMINE 20 MG PO TABS
20.0000 mg | ORAL_TABLET | Freq: Two times a day (BID) | ORAL | 0 refills | Status: DC
Start: 2023-02-11 — End: 2023-02-13
  Filled 2023-02-11 (×2): qty 60, 30d supply, fill #0

## 2023-02-13 ENCOUNTER — Other Ambulatory Visit: Payer: Self-pay

## 2023-02-13 ENCOUNTER — Other Ambulatory Visit (HOSPITAL_BASED_OUTPATIENT_CLINIC_OR_DEPARTMENT_OTHER): Payer: Self-pay

## 2023-02-13 MED ORDER — LISDEXAMFETAMINE DIMESYLATE 70 MG PO CAPS: 70.0000 mg | ORAL_CAPSULE | Freq: Every day | ORAL | 0 refills | Status: DC

## 2023-02-13 MED ORDER — AMPHETAMINE-DEXTROAMPHETAMINE 20 MG PO TABS
20.0000 mg | ORAL_TABLET | Freq: Two times a day (BID) | ORAL | 0 refills | Status: DC
Start: 2023-02-13 — End: 2023-12-11

## 2023-02-13 NOTE — Addendum Note (Signed)
Addended by: Kawehi Hostetter, Huntley Dec E on: 02/13/2023 04:11 PM   Modules accepted: Orders

## 2023-02-14 ENCOUNTER — Other Ambulatory Visit (HOSPITAL_BASED_OUTPATIENT_CLINIC_OR_DEPARTMENT_OTHER): Payer: Self-pay

## 2023-02-16 ENCOUNTER — Other Ambulatory Visit: Payer: Self-pay

## 2023-02-16 ENCOUNTER — Other Ambulatory Visit (HOSPITAL_BASED_OUTPATIENT_CLINIC_OR_DEPARTMENT_OTHER): Payer: Self-pay

## 2023-02-17 ENCOUNTER — Other Ambulatory Visit: Payer: Self-pay

## 2023-02-21 ENCOUNTER — Other Ambulatory Visit: Payer: Self-pay | Admitting: Nurse Practitioner

## 2023-02-21 DIAGNOSIS — F339 Major depressive disorder, recurrent, unspecified: Secondary | ICD-10-CM

## 2023-02-21 DIAGNOSIS — R6889 Other general symptoms and signs: Secondary | ICD-10-CM

## 2023-02-23 DIAGNOSIS — F339 Major depressive disorder, recurrent, unspecified: Secondary | ICD-10-CM | POA: Insufficient documentation

## 2023-02-23 DIAGNOSIS — R000 Tachycardia, unspecified: Secondary | ICD-10-CM

## 2023-02-23 DIAGNOSIS — D72829 Elevated white blood cell count, unspecified: Secondary | ICD-10-CM | POA: Insufficient documentation

## 2023-02-23 HISTORY — DX: Tachycardia, unspecified: R00.0

## 2023-02-23 NOTE — Assessment & Plan Note (Signed)
Jessica Graham has a longstanding history of attention deficit disorder with difficulty in management.  She is currently on Vyvanse and Adderall with dosing 3 times daily.  She has been on these medications for many years.  It is possible that these medications are contributing to the tachycardia that she has been experiencing given that they are stimulant medications.  However, due to the prolonged use of the medication and consistent dosing this is less likely.  At this time we are unable to increase the doses of her medication as she is on maximum doses however improved control may be established with the addition of Wellbutrin which would also help with her mood symptoms. Plan: Medication refills for Vyvanse and Adderall as needed for the next 6 months. Will monitor patient's response to Wellbutrin and ADD and depression symptoms. Plan

## 2023-02-23 NOTE — Assessment & Plan Note (Signed)
Ongoing tremors of the hands bilaterally with no known etiology.  Suspect this is a benign finding but will continue to monitor closely.

## 2023-02-23 NOTE — Assessment & Plan Note (Signed)
Recent evaluation of the thumb via x-ray was unrevealing of any concerning finding.  There is a slight delay discoloration noted at the cuticle area with no other associated symptoms.  At this time the cause of the symptoms are unclear.  I recommend monitoring for now.  I will be happy to send a referral to orthopedics if the patient is interested at any point.

## 2023-02-23 NOTE — Assessment & Plan Note (Signed)
And is experiencing symptoms of ongoing fatigue despite adequate sleep and use of B12 and ADHD medication.  She does have a history of depression and scored a 12 on the depression screening questionnaire, indicating depressive symptoms are present at this time.  She is not currently on any medication for this.  Given her history of ADHD joint decision was made to trial Wellbutrin to see if this is helpful for management of her symptoms. Plan: Start Wellbutrin 150 mg extended release once daily in the morning. Reassess response to medication in a couple of weeks and adjust dose if necessary. Encourage patient to continue therapy sessions. Report any adverse side effects or concerns immediately.

## 2023-02-23 NOTE — Assessment & Plan Note (Signed)
Persistent tachycardia noted with no alarm symptoms present.  Patient is on multiple doses of stimulant medication which could be contributing.  Also consider that this could be the patient's baseline.  We will plan to continue to monitor and reevaluate if symptoms appear to worsen or new concerns present.

## 2023-02-24 ENCOUNTER — Institutional Professional Consult (permissible substitution) (HOSPITAL_BASED_OUTPATIENT_CLINIC_OR_DEPARTMENT_OTHER): Payer: Managed Care, Other (non HMO) | Admitting: Pulmonary Disease

## 2023-03-12 ENCOUNTER — Institutional Professional Consult (permissible substitution) (HOSPITAL_BASED_OUTPATIENT_CLINIC_OR_DEPARTMENT_OTHER): Payer: Managed Care, Other (non HMO) | Admitting: Pulmonary Disease

## 2023-03-12 ENCOUNTER — Ambulatory Visit (HOSPITAL_COMMUNITY)
Admission: RE | Admit: 2023-03-12 | Discharge: 2023-03-12 | Disposition: A | Discharge status: Home / Self Care | Payer: Managed Care, Other (non HMO) | Source: Ambulatory Visit | Attending: Pulmonary Disease | Admitting: Pulmonary Disease

## 2023-03-12 ENCOUNTER — Other Ambulatory Visit: Payer: Self-pay

## 2023-03-12 ENCOUNTER — Other Ambulatory Visit (HOSPITAL_BASED_OUTPATIENT_CLINIC_OR_DEPARTMENT_OTHER): Payer: Self-pay

## 2023-03-12 ENCOUNTER — Encounter (HOSPITAL_BASED_OUTPATIENT_CLINIC_OR_DEPARTMENT_OTHER): Payer: Managed Care, Other (non HMO)

## 2023-03-12 DIAGNOSIS — R053 Chronic cough: Secondary | ICD-10-CM | POA: Insufficient documentation

## 2023-03-12 LAB — PULMONARY FUNCTION TEST
DL/VA % pred: 109 %
DL/VA: 4.89 ml/min/mmHg/L
DLCO unc % pred: 106 %
DLCO unc: 25.39 ml/min/mmHg
FEF 25-75 Post: 3.49 L/sec
FEF 25-75 Pre: 2.7 L/sec
FEF2575-%Change-Post: 29 %
FEF2575-%Pred-Post: 101 %
FEF2575-%Pred-Pre: 78 %
FEV1-%Change-Post: 7 %
FEV1-%Pred-Post: 103 %
FEV1-%Pred-Pre: 96 %
FEV1-Post: 3.45 L
FEV1-Pre: 3.22 L
FEV1FVC-%Change-Post: 4 %
FEV1FVC-%Pred-Pre: 92 %
FEV6-%Change-Post: 3 %
FEV6-%Pred-Post: 108 %
FEV6-%Pred-Pre: 104 %
FEV6-Post: 4.27 L
FEV6-Pre: 4.14 L
FEV6FVC-%Change-Post: 0 %
FEV6FVC-%Pred-Post: 101 %
FEV6FVC-%Pred-Pre: 100 %
FVC-%Change-Post: 2 %
FVC-%Pred-Post: 106 %
FVC-%Pred-Pre: 104 %
FVC-Post: 4.28 L
FVC-Pre: 4.17 L
Post FEV1/FVC ratio: 81 %
Post FEV6/FVC ratio: 100 %
Pre FEV1/FVC ratio: 77 %
Pre FEV6/FVC Ratio: 99 %
RV % pred: 86 %
RV: 1.35 L
TLC % pred: 106 %
TLC: 5.68 L

## 2023-03-12 MED ORDER — ALBUTEROL SULFATE (2.5 MG/3ML) 0.083% IN NEBU
2.5000 mg | INHALATION_SOLUTION | Freq: Once | RESPIRATORY_TRACT | Status: AC
  Administered 2023-03-12: 2.5 mg via RESPIRATORY_TRACT

## 2023-03-13 ENCOUNTER — Ambulatory Visit (HOSPITAL_BASED_OUTPATIENT_CLINIC_OR_DEPARTMENT_OTHER): Payer: Managed Care, Other (non HMO) | Admitting: Pulmonary Disease

## 2023-03-13 ENCOUNTER — Other Ambulatory Visit (HOSPITAL_COMMUNITY): Payer: Self-pay

## 2023-03-13 ENCOUNTER — Encounter (HOSPITAL_BASED_OUTPATIENT_CLINIC_OR_DEPARTMENT_OTHER): Payer: Self-pay | Admitting: Pulmonary Disease

## 2023-03-13 VITALS — BP 126/82 | HR 65 | Ht 66.0 in | Wt 159.0 lb

## 2023-03-13 DIAGNOSIS — J4530 Mild persistent asthma, uncomplicated: Secondary | ICD-10-CM | POA: Diagnosis not present

## 2023-03-13 DIAGNOSIS — J45909 Unspecified asthma, uncomplicated: Secondary | ICD-10-CM | POA: Insufficient documentation

## 2023-03-13 MED ORDER — BUDESONIDE-FORMOTEROL FUMARATE 80-4.5 MCG/ACT IN AERO: 2.0000 | INHALATION_SPRAY | Freq: Two times a day (BID) | RESPIRATORY_TRACT | 5 refills | Status: DC

## 2023-03-13 MED ORDER — MONTELUKAST SODIUM 10 MG PO TABS
10.0000 mg | ORAL_TABLET | Freq: Every day | ORAL | 5 refills | Status: DC
Start: 2023-03-13 — End: 2023-06-30

## 2023-03-13 NOTE — Patient Instructions (Signed)
Asthmatic bronchitis - improving but persistent symtpoms Cough variant asthma --Reviewed PFTs. No obstructive defect but partial response bronchodilators --START Symbicort ONE puff in the morning and evening. Rinse mouth after use  >Increase to TWO puffs if symptoms only partially treated  >OK to trial off later this summer and restart when symptom recur --CONTINUE Albuterol AS NEEDED --START Singulair 10 mg daily

## 2023-03-13 NOTE — Progress Notes (Signed)
Subjective:   PATIENT ID: Jessica Graham GENDER: female DOB: 08-16-1986, MRN: [id number]  Chief Complaint  Patient presents with   Consult    Referred by PCP for chronic cough for the past few months. Cough has been semi-productive with clear phlegm. Had PFT done yesterday.     Reason for Visit: New consult for chronic cough  Ms. Jessica Graham is a 37 year old female never smoker with ADHD, depression who presents for evaluation for chronic cough.  She reports persistent prolonged cough after illnesses. Sometimes associated wheezing at night.  She reports seeing her PCP in April 2024 and treated with cough syrup. Sometimes has nasal congestion. This seems to occur annually and will last for several months. Late fall/early winter seems to when her daughter starts school. Unsure if she had this before her daughter who is 66 years old. In her 11s had possibly similar episodes but not this severe. Denies childhood history of asthma.  She is currently on the tail end of this episode of coughing with minimal sputum production. Has tried albuterol inhaler and tessalon perles in the past.   Social History: Never smoker  I have personally reviewed patient's past medical/family/social history, allergies, current medications.  Past Medical History:  Diagnosis Date   ADD (attention deficit disorder)    Anxiety    Depression    Gestational hypertension      History reviewed. No pertinent family history.   Social History   Occupational History   Not on file  Tobacco Use   Smoking status: Never   Smokeless tobacco: Never  Substance and Sexual Activity   Alcohol use: Yes   Drug use: Never   Sexual activity: Yes    Birth control/protection: Pill    No Known Allergies   Outpatient Medications Prior to Visit  Medication Sig Dispense Refill   amphetamine-dextroamphetamine (ADDERALL) 20 MG tablet Take 1 tablet (20 mg total) by mouth 2 (two) times daily. 1 of 3 60 tablet 0   [START ON  04/08/2023] amphetamine-dextroamphetamine (ADDERALL) 20 MG tablet Take 1 tablet (20 mg total) by mouth 2 (two) times daily. Script 3 of 3 60 tablet 0   amphetamine-dextroamphetamine (ADDERALL) 20 MG tablet Take 1 tablet (20 mg total) by mouth 2 (two) times daily. 60 tablet 0   amphetamine-dextroamphetamine (ADDERALL) 20 MG tablet Take 1 tablet (20 mg total) by mouth 2 (two) times daily. 60 tablet 0   buPROPion (WELLBUTRIN XL) 150 MG 24 hr tablet Take 1 tablet (150 mg total) by mouth daily. 30 tablet 2   cholecalciferol (VITAMIN D3) 25 MCG (1000 UNIT) tablet Take 1,000 Units by mouth daily. Pt. Takes 2,000 units dialy     lisdexamfetamine (VYVANSE) 70 MG capsule Take 1 capsule (70 mg total) by mouth daily. 30 capsule 0   [START ON 04/03/2023] lisdexamfetamine (VYVANSE) 70 MG capsule Take 1 capsule (70 mg total) by mouth daily. 30 capsule 0   lisdexamfetamine (VYVANSE) 70 MG capsule Take 1 capsule (70 mg total) by mouth daily. 30 capsule 0   norethindrone (MICRONOR) 0.35 MG tablet Take 1 tablet by mouth every morning.     omeprazole (PRILOSEC) 20 MG capsule Take 20 mg by mouth daily.     No facility-administered medications prior to visit.    Review of Systems  Constitutional:  Negative for chills, diaphoresis, fever, malaise/fatigue and weight loss.  HENT:  Negative for congestion.   Respiratory:  Positive for cough and shortness of breath. Negative for hemoptysis, sputum production  and wheezing.   Cardiovascular:  Negative for chest pain, palpitations and leg swelling.     Objective:   Vitals:   03/13/23 1034  BP: 126/82  Pulse: 65  SpO2: 100%  Weight: 159 lb (72.1 kg)  Height: 5\' 6"  (1.676 m)   SpO2: 100 % (on RA)  Physical Exam: General: Well-appearing, no acute distress HENT: [place], AT Eyes: EOMI, no scleral icterus Respiratory: Clear to auscultation bilaterally.  No crackles, wheezing or rales Cardiovascular: RRR, -M/R/G, no JVD Extremities:-Edema,-tenderness Neuro: AAO x4,  CNII-XII grossly intact Psych: Normal mood, normal affect  Data Reviewed:  Imaging: None on file  PFT: 03/12/23 FVC 4.28 (106%) FEV1 3.45 (103%) Ratio 77  TLC 106% DLCO 106%. Partial bronchodilator response however does not preclude benefit of inhaler treatment Interpretation: Normal PFTs  Labs: CBC    Component Value Date/Time   WBC 11.6 (H) 12/15/2022 0946   WBC 15.8 (H) 10/19/2020 0957   RBC 4.17 12/15/2022 0946   RBC 4.29 10/19/2020 0957   HGB 12.5 12/15/2022 0946   HCT 38.7 12/15/2022 0946   PLT 373 12/15/2022 0946   MCV 93 12/15/2022 0946   MCH 30.0 12/15/2022 0946   MCH 30.3 10/19/2020 0957   MCHC 32.3 12/15/2022 0946   MCHC 32.4 10/19/2020 0957   RDW 11.7 12/15/2022 0946   LYMPHSABS 2.6 12/15/2022 0946   EOSABS 0.3 12/15/2022 0946   BASOSABS 0.1 12/15/2022 0946   Absolute eos 12/15/22 - 300     Assessment & Plan:   Discussion: 37 year old female never smoker with ADHD, depression who presents for evaluation for chronic cough. Common causes of cough were discussed including upper airway cough syndrome, reflux and undiagnosed obstructive lung disease. After review of her symptoms and PFTs, her symptoms are consistent with cough variant asthma or asthmatic bronchitis that could benefit bronchodilators during her peak seasons. Discussed clinical course and management of asthma including bronchodilator regimen, preventive care including vaccinations and action plan for exacerbation.   Asthmatic bronchitis - improving but persistent symtpoms Cough variant asthma --Reviewed PFTs. No obstructive defect but partial response bronchodilators --START Symbicort ONE puff in the morning and evening. Rinse mouth after use  >Increase to TWO puffs if symptoms only partially treated  >OK to trial off later this summer and restart when symptom recur --CONTINUE Albuterol AS NEEDED --START Singulair 10 mg daily    Health Maintenance Immunization History  Administered Date(s)  Administered   HPV 9-valent 07/27/2019, 10/30/2021, 04/29/2022   Influenza Split 07/11/2015   Influenza,inj,Quad PF,6+ Mos 06/11/2017, 12/07/2018, 05/27/2019   Influenza,inj,quad, With Preservative 05/16/2020   Influenza-Unspecified 06/15/2021   PFIZER Comirnaty(Gray Top)Covid-19 Tri-Sucrose Vaccine 11/11/2019, 12/02/2019   PFIZER(Purple Top)SARS-COV-2 Vaccination 08/22/2020   Pfizer Covid-19 Vaccine Bivalent Booster 20yrs & up 08/13/2021   Tdap 04/20/2015, 07/11/2015, 04/27/2019   CT Lung Screen - not qualified  No orders of the defined types were placed in this encounter.  Meds ordered this encounter  Medications   montelukast (SINGULAIR) 10 MG tablet    Sig: Take 1 tablet (10 mg total) by mouth at bedtime.    Dispense:  30 tablet    Refill:  5   budesonide-formoterol (SYMBICORT) 80-4.5 MCG/ACT inhaler    Sig: Inhale 2 puffs into the lungs 2 (two) times daily.    Dispense:  1 each    Refill:  5    Generic one    Return in about 4 months (around 07/13/2023) for October - any date.  I have spent a total  time of 45-minutes on the day of the appointment reviewing prior documentation, coordinating care and discussing medical diagnosis and plan with the patient/family. Imaging, labs and tests included in this note have been reviewed and interpreted independently by me.  Lilyan Prete Mechele Collin, MD [facility] 03/13/2023 11:20 AM  Office Number [phone]

## 2023-03-14 ENCOUNTER — Other Ambulatory Visit (HOSPITAL_BASED_OUTPATIENT_CLINIC_OR_DEPARTMENT_OTHER): Payer: Self-pay

## 2023-03-15 ENCOUNTER — Encounter: Payer: Self-pay | Admitting: Nurse Practitioner

## 2023-03-16 ENCOUNTER — Other Ambulatory Visit (HOSPITAL_BASED_OUTPATIENT_CLINIC_OR_DEPARTMENT_OTHER): Payer: Self-pay

## 2023-03-23 ENCOUNTER — Other Ambulatory Visit: Payer: Self-pay | Admitting: Nurse Practitioner

## 2023-03-23 DIAGNOSIS — R6889 Other general symptoms and signs: Secondary | ICD-10-CM

## 2023-03-23 DIAGNOSIS — F339 Major depressive disorder, recurrent, unspecified: Secondary | ICD-10-CM

## 2023-03-23 NOTE — Telephone Encounter (Signed)
90 day supply requested last apt 01/31/23.

## 2023-04-09 ENCOUNTER — Other Ambulatory Visit (HOSPITAL_BASED_OUTPATIENT_CLINIC_OR_DEPARTMENT_OTHER): Payer: Self-pay

## 2023-04-09 ENCOUNTER — Other Ambulatory Visit: Payer: Self-pay | Admitting: Nurse Practitioner

## 2023-04-09 DIAGNOSIS — F900 Attention-deficit hyperactivity disorder, predominantly inattentive type: Secondary | ICD-10-CM

## 2023-04-09 MED ORDER — AMPHETAMINE-DEXTROAMPHETAMINE 20 MG PO TABS
20.0000 mg | ORAL_TABLET | Freq: Two times a day (BID) | ORAL | 0 refills | Status: DC
Start: 2023-04-09 — End: 2023-05-12
  Filled 2023-04-09: qty 60, 30d supply, fill #0

## 2023-04-09 NOTE — Telephone Encounter (Signed)
Refill request last apt 01/30/23

## 2023-04-10 ENCOUNTER — Encounter: Payer: Self-pay | Admitting: Nurse Practitioner

## 2023-04-10 ENCOUNTER — Other Ambulatory Visit: Payer: Self-pay | Admitting: Nurse Practitioner

## 2023-04-10 DIAGNOSIS — F900 Attention-deficit hyperactivity disorder, predominantly inattentive type: Secondary | ICD-10-CM

## 2023-04-10 NOTE — Telephone Encounter (Signed)
There is already a script in the system from 04/03/23

## 2023-04-11 MED ORDER — LISDEXAMFETAMINE DIMESYLATE 70 MG PO CAPS
70.0000 mg | ORAL_CAPSULE | Freq: Every day | ORAL | 0 refills | Status: DC
Start: 2023-04-11 — End: 2023-05-12
  Filled 2023-04-11: qty 30, 30d supply, fill #0

## 2023-04-13 ENCOUNTER — Other Ambulatory Visit (HOSPITAL_BASED_OUTPATIENT_CLINIC_OR_DEPARTMENT_OTHER): Payer: Self-pay

## 2023-05-12 ENCOUNTER — Other Ambulatory Visit: Payer: Self-pay | Admitting: Nurse Practitioner

## 2023-05-12 DIAGNOSIS — F900 Attention-deficit hyperactivity disorder, predominantly inattentive type: Secondary | ICD-10-CM

## 2023-05-12 NOTE — Telephone Encounter (Signed)
Last apt 01/30/23

## 2023-05-13 ENCOUNTER — Other Ambulatory Visit (HOSPITAL_BASED_OUTPATIENT_CLINIC_OR_DEPARTMENT_OTHER): Payer: Self-pay

## 2023-05-13 MED ORDER — AMPHETAMINE-DEXTROAMPHETAMINE 20 MG PO TABS
20.0000 mg | ORAL_TABLET | Freq: Two times a day (BID) | ORAL | 0 refills | Status: AC
Start: 2023-05-13 — End: ?
  Filled 2023-05-13: qty 60, 30d supply, fill #0

## 2023-05-13 MED ORDER — LISDEXAMFETAMINE DIMESYLATE 70 MG PO CAPS
70.0000 mg | ORAL_CAPSULE | Freq: Every day | ORAL | 0 refills | Status: AC
Start: 2023-05-13 — End: ?
  Filled 2023-05-13: qty 30, 30d supply, fill #0

## 2023-06-11 ENCOUNTER — Other Ambulatory Visit: Payer: Self-pay | Admitting: Nurse Practitioner

## 2023-06-11 DIAGNOSIS — F900 Attention-deficit hyperactivity disorder, predominantly inattentive type: Secondary | ICD-10-CM

## 2023-06-11 NOTE — Telephone Encounter (Signed)
Last apt 01/30/23

## 2023-06-12 ENCOUNTER — Other Ambulatory Visit (HOSPITAL_BASED_OUTPATIENT_CLINIC_OR_DEPARTMENT_OTHER): Payer: Self-pay

## 2023-06-12 MED ORDER — AMPHETAMINE-DEXTROAMPHETAMINE 20 MG PO TABS
20.0000 mg | ORAL_TABLET | Freq: Two times a day (BID) | ORAL | 0 refills | Status: DC
Start: 2023-06-12 — End: 2023-07-10
  Filled 2023-06-12: qty 60, 30d supply, fill #0

## 2023-06-12 MED ORDER — LISDEXAMFETAMINE DIMESYLATE 70 MG PO CAPS
70.0000 mg | ORAL_CAPSULE | Freq: Every day | ORAL | 0 refills | Status: DC
Start: 2023-06-12 — End: 2023-06-30
  Filled 2023-06-12: qty 30, 30d supply, fill #0

## 2023-06-30 ENCOUNTER — Telehealth: Payer: BC Managed Care – PPO | Admitting: Nurse Practitioner

## 2023-06-30 ENCOUNTER — Other Ambulatory Visit (HOSPITAL_BASED_OUTPATIENT_CLINIC_OR_DEPARTMENT_OTHER): Payer: Self-pay

## 2023-06-30 ENCOUNTER — Other Ambulatory Visit: Payer: Self-pay

## 2023-06-30 ENCOUNTER — Encounter: Payer: Self-pay | Admitting: Nurse Practitioner

## 2023-06-30 VITALS — Wt 155.0 lb

## 2023-06-30 DIAGNOSIS — F339 Major depressive disorder, recurrent, unspecified: Secondary | ICD-10-CM | POA: Diagnosis not present

## 2023-06-30 DIAGNOSIS — F900 Attention-deficit hyperactivity disorder, predominantly inattentive type: Secondary | ICD-10-CM | POA: Diagnosis not present

## 2023-06-30 MED ORDER — LISDEXAMFETAMINE DIMESYLATE 70 MG PO CAPS
70.0000 mg | ORAL_CAPSULE | Freq: Every day | ORAL | 0 refills | Status: DC
Start: 2023-08-09 — End: 2023-12-11

## 2023-06-30 MED ORDER — BUPROPION HCL ER (XL) 150 MG PO TB24
300.0000 mg | ORAL_TABLET | Freq: Every day | ORAL | 3 refills | Status: DC
Start: 2023-06-30 — End: 2024-06-26
  Filled 2023-06-30: qty 180, 90d supply, fill #0
  Filled 2023-09-24: qty 180, 90d supply, fill #1
  Filled 2023-12-28: qty 180, 90d supply, fill #2
  Filled 2024-03-27: qty 180, 90d supply, fill #3

## 2023-06-30 MED ORDER — LISDEXAMFETAMINE DIMESYLATE 70 MG PO CAPS
70.0000 mg | ORAL_CAPSULE | Freq: Every day | ORAL | 0 refills | Status: DC
Start: 2023-07-10 — End: 2023-08-11
  Filled 2023-07-10 – 2023-07-13 (×2): qty 30, 30d supply, fill #0

## 2023-06-30 MED ORDER — LISDEXAMFETAMINE DIMESYLATE 70 MG PO CAPS
70.0000 mg | ORAL_CAPSULE | Freq: Every day | ORAL | 0 refills | Status: DC
Start: 2023-09-07 — End: 2023-12-11

## 2023-06-30 NOTE — Progress Notes (Signed)
Virtual Visit Encounter mychart visit.   I connected with  Jessica Graham on 06/30/23 at  1:30 PM EDT by secure video and audio telemedicine application. I verified that I am speaking with the correct person using two identifiers.   I introduced myself as a Publishing rights manager with the practice. The limitations of evaluation and management by telemedicine discussed with the patient and the availability of in person appointments. The patient expressed verbal understanding and consent to proceed.  Participating parties in this visit include: Myself and patient  The patient is: Patient Location: Other:  car I am: Provider Location: Office/Clinic Subjective:    CC and HPI: Jessica Graham is a 37 y.o. year old female presenting for follow up of depression.  Jessica Graham reports having a few episodes of dips in her mood for several days at a time which have negatively impacted her ability to stay motivated to do her daily activities during those periods. She reports that after a few weeks it seems that her symptoms will improve, but during the period of worsening she feels similar to how she felt before she started medication. She is concerned that she may need to make a change to the medication or dose, but wanted my opinion on this.   Past medical history, Surgical history, Family history not pertinant except as noted below, Social history, Allergies, and medications have been entered into the medical record, reviewed, and corrections made.   Review of Systems:  All review of systems negative except what is listed in the HPI  Objective:    Alert and oriented x 4 Speaking in clear sentences with no shortness of breath. No distress.  Impression and Recommendations:    Problem List Items Addressed This Visit     Attention deficit hyperactivity disorder, predominantly inattentive type    Chronic. Stable at this time. She is a rapid metabolizer with very difficult to control ADHD requiring Chloeann Alfred morning  dosing and twice daily afternoon dosing. No concerning symptoms present at this time. Refills provided.       Relevant Medications   lisdexamfetamine (VYVANSE) 70 MG capsule (Start on 07/10/2023)   lisdexamfetamine (VYVANSE) 70 MG capsule (Start on 08/09/2023)   lisdexamfetamine (VYVANSE) 70 MG capsule (Start on 09/07/2023)   Depression, recurrent (HCC) - Primary    Intermittent episodes of breakthrough depression symptoms with significant changes in mood, energy level, and motivation during these times. Other times she does well on the wellbutrin 150mg . There have been no significant life changes or stressors present during the breakthrough periods that she can identify.  Given the symptoms and concern for breakthrough symptoms that do not improve and the potential for prolonged recognition of this, we will increase wellbutrin dose to 300mg  daily. I will send in as 2, 150mg  tablets to be taken at the same time daily. She has been instructed to decrease the dose back to 150mg  if she feels the dosage is too much for her or if she begins to notice undesirable side effects. She will reach out with mychart in a few weeks to let me know how it is going.       Relevant Medications   buPROPion (WELLBUTRIN XL) 150 MG 24 hr tablet    orders and follow up as documented in EMR I discussed the assessment and treatment plan with the patient. The patient was provided an opportunity to ask questions and all were answered. The patient agreed with the plan and demonstrated an understanding of the instructions.   The  patient was advised to call back or seek an in-person evaluation if the symptoms worsen or if the condition fails to improve as anticipated.  Follow-Up: MyChart check in in a few weeks to let me know how she is feeling  I provided 18 minutes of non-face-to-face interaction with this non face-to-face encounter including intake, same-day documentation, and chart review.   [name], NP , DNP,  AGNP-c [facility]

## 2023-06-30 NOTE — Assessment & Plan Note (Signed)
Intermittent episodes of breakthrough depression symptoms with significant changes in mood, energy level, and motivation during these times. Other times she does well on the wellbutrin 150mg . There have been no significant life changes or stressors present during the breakthrough periods that she can identify.  Given the symptoms and concern for breakthrough symptoms that do not improve and the potential for prolonged recognition of this, we will increase wellbutrin dose to 300mg  daily. I will send in as 2, 150mg  tablets to be taken at the same time daily. She has been instructed to decrease the dose back to 150mg  if she feels the dosage is too much for her or if she begins to notice undesirable side effects. She will reach out with mychart in a few weeks to let me know how it is going.

## 2023-06-30 NOTE — Assessment & Plan Note (Signed)
Chronic. Stable at this time. She is a rapid metabolizer with very difficult to control ADHD requiring Glenda Kunst morning dosing and twice daily afternoon dosing. No concerning symptoms present at this time. Refills provided.

## 2023-07-10 ENCOUNTER — Other Ambulatory Visit: Payer: Self-pay | Admitting: Nurse Practitioner

## 2023-07-10 DIAGNOSIS — F900 Attention-deficit hyperactivity disorder, predominantly inattentive type: Secondary | ICD-10-CM

## 2023-07-11 ENCOUNTER — Other Ambulatory Visit (HOSPITAL_BASED_OUTPATIENT_CLINIC_OR_DEPARTMENT_OTHER): Payer: Self-pay

## 2023-07-13 ENCOUNTER — Other Ambulatory Visit: Payer: Self-pay

## 2023-07-13 ENCOUNTER — Other Ambulatory Visit (HOSPITAL_BASED_OUTPATIENT_CLINIC_OR_DEPARTMENT_OTHER): Payer: Self-pay

## 2023-07-13 MED ORDER — NORETHINDRONE 0.35 MG PO TABS
1.0000 | ORAL_TABLET | Freq: Every morning | ORAL | 5 refills | Status: DC
  Filled 2023-07-13: qty 28, 28d supply, fill #0
  Filled 2023-08-11: qty 28, 28d supply, fill #1
  Filled 2023-09-10: qty 28, 28d supply, fill #2
  Filled 2023-11-08: qty 28, 28d supply, fill #3
  Filled 2023-12-11: qty 28, 28d supply, fill #4

## 2023-07-13 NOTE — Telephone Encounter (Signed)
Last apt. 06/30/23

## 2023-07-14 MED ORDER — AMPHETAMINE-DEXTROAMPHETAMINE 20 MG PO TABS
20.0000 mg | ORAL_TABLET | Freq: Two times a day (BID) | ORAL | 0 refills | Status: DC
Start: 2023-07-14 — End: 2023-09-10
  Filled 2023-07-14 – 2023-08-11 (×2): qty 60, 30d supply, fill #0

## 2023-07-15 ENCOUNTER — Other Ambulatory Visit (HOSPITAL_BASED_OUTPATIENT_CLINIC_OR_DEPARTMENT_OTHER): Payer: Self-pay

## 2023-08-04 ENCOUNTER — Other Ambulatory Visit (HOSPITAL_BASED_OUTPATIENT_CLINIC_OR_DEPARTMENT_OTHER): Payer: Self-pay

## 2023-08-04 MED ORDER — FLULAVAL 0.5 ML IM SUSY
0.5000 mL | PREFILLED_SYRINGE | Freq: Once | INTRAMUSCULAR | 0 refills | Status: AC
  Filled 2023-08-04: qty 0.5, 1d supply, fill #0

## 2023-08-04 MED ORDER — COMIRNATY 30 MCG/0.3ML IM SUSY
0.3000 mL | PREFILLED_SYRINGE | Freq: Once | INTRAMUSCULAR | 0 refills | Status: AC
  Filled 2023-08-04: qty 0.3, 1d supply, fill #0

## 2023-08-11 ENCOUNTER — Other Ambulatory Visit: Payer: Self-pay | Admitting: Nurse Practitioner

## 2023-08-11 ENCOUNTER — Other Ambulatory Visit: Payer: Self-pay

## 2023-08-11 ENCOUNTER — Other Ambulatory Visit (HOSPITAL_BASED_OUTPATIENT_CLINIC_OR_DEPARTMENT_OTHER): Payer: Self-pay

## 2023-08-11 DIAGNOSIS — F900 Attention-deficit hyperactivity disorder, predominantly inattentive type: Secondary | ICD-10-CM

## 2023-08-11 MED ORDER — LISDEXAMFETAMINE DIMESYLATE 70 MG PO CAPS
70.0000 mg | ORAL_CAPSULE | Freq: Every day | ORAL | 0 refills | Status: DC
  Filled 2023-08-11: qty 30, 30d supply, fill #0

## 2023-08-11 NOTE — Telephone Encounter (Signed)
Last apt 01/23/23

## 2023-08-12 ENCOUNTER — Other Ambulatory Visit (HOSPITAL_BASED_OUTPATIENT_CLINIC_OR_DEPARTMENT_OTHER): Payer: Self-pay

## 2023-09-10 ENCOUNTER — Other Ambulatory Visit (HOSPITAL_BASED_OUTPATIENT_CLINIC_OR_DEPARTMENT_OTHER): Payer: Self-pay

## 2023-09-10 ENCOUNTER — Other Ambulatory Visit: Payer: Self-pay | Admitting: Nurse Practitioner

## 2023-09-10 ENCOUNTER — Other Ambulatory Visit: Payer: Self-pay

## 2023-09-10 DIAGNOSIS — F900 Attention-deficit hyperactivity disorder, predominantly inattentive type: Secondary | ICD-10-CM

## 2023-09-10 MED ORDER — LISDEXAMFETAMINE DIMESYLATE 70 MG PO CAPS
70.0000 mg | ORAL_CAPSULE | Freq: Every day | ORAL | 0 refills | Status: DC
  Filled 2023-09-10: qty 30, 30d supply, fill #0

## 2023-09-10 MED ORDER — AMPHETAMINE-DEXTROAMPHETAMINE 20 MG PO TABS
20.0000 mg | ORAL_TABLET | Freq: Two times a day (BID) | ORAL | 0 refills | Status: DC
  Filled 2023-09-10: qty 60, 30d supply, fill #0

## 2023-09-16 ENCOUNTER — Other Ambulatory Visit (HOSPITAL_BASED_OUTPATIENT_CLINIC_OR_DEPARTMENT_OTHER): Payer: Self-pay | Admitting: Pulmonary Disease

## 2023-10-09 ENCOUNTER — Other Ambulatory Visit: Payer: Self-pay | Admitting: Nurse Practitioner

## 2023-10-09 ENCOUNTER — Other Ambulatory Visit (HOSPITAL_BASED_OUTPATIENT_CLINIC_OR_DEPARTMENT_OTHER): Payer: Self-pay

## 2023-10-09 DIAGNOSIS — F900 Attention-deficit hyperactivity disorder, predominantly inattentive type: Secondary | ICD-10-CM

## 2023-10-09 MED ORDER — LISDEXAMFETAMINE DIMESYLATE 70 MG PO CAPS
70.0000 mg | ORAL_CAPSULE | Freq: Every day | ORAL | 0 refills | Status: DC
  Filled 2023-10-09: qty 30, 30d supply, fill #0

## 2023-10-09 MED ORDER — AMPHETAMINE-DEXTROAMPHETAMINE 20 MG PO TABS
20.0000 mg | ORAL_TABLET | Freq: Two times a day (BID) | ORAL | 0 refills | Status: DC
  Filled 2023-10-09: qty 60, 30d supply, fill #0

## 2023-10-09 NOTE — Telephone Encounter (Signed)
Last apt 06/30/23.

## 2023-10-20 ENCOUNTER — Other Ambulatory Visit: Payer: Self-pay | Admitting: Nurse Practitioner

## 2023-10-20 DIAGNOSIS — R110 Nausea: Secondary | ICD-10-CM

## 2023-11-08 ENCOUNTER — Other Ambulatory Visit: Payer: Self-pay | Admitting: Nurse Practitioner

## 2023-11-08 DIAGNOSIS — F900 Attention-deficit hyperactivity disorder, predominantly inattentive type: Secondary | ICD-10-CM

## 2023-11-09 ENCOUNTER — Other Ambulatory Visit: Payer: Self-pay

## 2023-11-09 ENCOUNTER — Other Ambulatory Visit (HOSPITAL_BASED_OUTPATIENT_CLINIC_OR_DEPARTMENT_OTHER): Payer: Self-pay

## 2023-11-09 MED ORDER — AMPHETAMINE-DEXTROAMPHETAMINE 20 MG PO TABS
20.0000 mg | ORAL_TABLET | Freq: Two times a day (BID) | ORAL | 0 refills | Status: DC
  Filled 2023-11-09: qty 60, 30d supply, fill #0

## 2023-11-09 MED ORDER — LISDEXAMFETAMINE DIMESYLATE 70 MG PO CAPS
70.0000 mg | ORAL_CAPSULE | Freq: Every day | ORAL | 0 refills | Status: DC
  Filled 2023-11-09: qty 30, 30d supply, fill #0

## 2023-11-09 NOTE — Telephone Encounter (Signed)
 Last apt 06/30/23.

## 2023-11-10 ENCOUNTER — Encounter: Payer: Self-pay | Admitting: Nurse Practitioner

## 2023-11-10 ENCOUNTER — Other Ambulatory Visit: Payer: Self-pay

## 2023-11-10 DIAGNOSIS — R110 Nausea: Secondary | ICD-10-CM

## 2023-11-10 MED ORDER — ONDANSETRON HCL 8 MG PO TABS: 8.0000 mg | ORAL_TABLET | Freq: Three times a day (TID) | ORAL | 5 refills | Status: AC | PRN

## 2023-11-23 ENCOUNTER — Ambulatory Visit: Payer: Self-pay | Admitting: Orthopedic Surgery

## 2023-11-23 ENCOUNTER — Ambulatory Visit (INDEPENDENT_AMBULATORY_CARE_PROVIDER_SITE_OTHER): Admitting: Student

## 2023-11-23 ENCOUNTER — Ambulatory Visit (INDEPENDENT_AMBULATORY_CARE_PROVIDER_SITE_OTHER)

## 2023-11-23 DIAGNOSIS — M25572 Pain in left ankle and joints of left foot: Secondary | ICD-10-CM | POA: Diagnosis not present

## 2023-11-23 DIAGNOSIS — M25562 Pain in left knee: Secondary | ICD-10-CM

## 2023-11-23 NOTE — Progress Notes (Signed)
 Chief Complaint: Left knee and ankle pain     History of Present Illness:   Discussed the use of AI scribe software for clinical note transcription with the patient, who gave verbal consent to proceed.   Jessica Graham is a 38 y.o. female presenting with left knee and ankle pain following a skateboarding injury. She has been experiencing knee and ankle pain since January 3rd after a skateboarding incident where she landed awkwardly. Initially, she suspected a broken ankle due to the pain but assumed it was a sprain as she could still walk. She has been following RICE protocols since the injury. The ankle was initially more painful, swollen, and bruised, leading her to focus on it more than the knee.  Over time, the knee pain has worsened, especially after activities like gardening, causing significant soreness that wakes her at night. The knee does not swell noticeably, nor does it buckle or give out, but it is painful with twisting motions and sometimes clicks. She occasionally takes Advil for pain, particularly when it disrupts her sleep. She has a history of previous ankle injuries, including a childhood injury, and mentions that her ankle has always been somewhat unstable, twisting easily. 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 She engages in activities like skateboarding and gardening, which have been impacted by her current injuries.  Surgical History:   None  PMH/PSH/Family History/Social History/Meds/Allergies:    Past Medical History:  Diagnosis Date   ADD (attention deficit disorder)    Anxiety    Depression    Fatigue 10/30/2021   Gestational hypertension    Poor motivation 01/30/2023   Recent skin changes 12/15/2022   Tachycardia, unspecified 02/23/2023   Past Surgical History:  Procedure Laterality Date   LAPAROSCOPIC APPENDECTOMY N/A 10/19/2020   Procedure: APPENDECTOMY LAPAROSCOPIC;  Surgeon: Axel Filler, MD;  Location: [facility] OR;  Service: General;  Laterality: N/A;   TONSILLECTOMY  2008   Social History   Socioeconomic History   Marital status: Married    Spouse name: Not on file   Number of children: Not on file   Years of education: Not on file   Highest education level: Not on file  Occupational History   Not on file  Tobacco Use   Smoking status: Never   Smokeless tobacco: Never  Substance and Sexual Activity   Alcohol use: Yes   Drug use: Never   Sexual activity: Yes    Birth control/protection: Pill  Other Topics Concern   Not on file  Social History Narrative   Not on file   Social Drivers of Health   Financial Resource Strain: Low Risk  (12/01/2019)   Received from [facility], [facility]   Overall Financial Resource Strain (CARDIA)    Difficulty of Paying Living Expenses: Not hard at all  Food Insecurity: No Food Insecurity (12/01/2019)   Received from [facility], [facility]   Hunger Vital Sign    Worried About Running Out of Food in the Last Year: Never true    Ran Out of Food in the Last Year: Never true  Transportation Needs: No Transportation Needs (12/01/2019)   Received from [facility], [facility]   [facility] - Transportation    Lack of Transportation (Medical): No  Lack of Transportation (Non-Medical): No  Physical Activity: Insufficiently Active (12/01/2019)   Received from [facility], [facility]   Exercise Vital Sign    Days of Exercise per Week: 4 days    Minutes of Exercise per Session: 20 min  Stress: No Stress Concern Present (12/01/2019)   Received from [facility], [facility] - Occupational Stress Questionnaire    Feeling of Stress : Not at all  Social Connections: Unknown (12/01/2019)   Received from [facility], [facility]   Social Connection and Isolation Panel [NHANES]    Frequency of Communication with Friends and Family: Once a week     Frequency of Social Gatherings with Friends and Family: Once a week    Attends Religious Services: Never    Database administrator or Organizations: No    Attends Banker Meetings: Never    Marital Status: Not on file   No family history on file. No Known Allergies Current Outpatient Medications  Medication Sig Dispense Refill   amphetamine-dextroamphetamine (ADDERALL) 20 MG tablet Take 1 tablet (20 mg total) by mouth 2 (two) times daily. 60 tablet 0   amphetamine-dextroamphetamine (ADDERALL) 20 MG tablet Take 1 tablet (20 mg total) by mouth 2 (two) times daily. 60 tablet 0   amphetamine-dextroamphetamine (ADDERALL) 20 MG tablet Take 1 tablet (20 mg total) by mouth 2 (two) times daily. 60 tablet 0   amphetamine-dextroamphetamine (ADDERALL) 20 MG tablet Take 1 tablet (20 mg total) by mouth 2 (two) times daily. 60 tablet 0   budesonide-formoterol (SYMBICORT) 80-4.5 MCG/ACT inhaler Inhale 2 puffs into the lungs 2 (two) times daily. 1 each 5   buPROPion (WELLBUTRIN XL) 150 MG 24 hr tablet Take 2 tablets (300 mg total) by mouth daily. 180 tablet 3   cholecalciferol (VITAMIN D3) 25 MCG (1000 UNIT) tablet Take 1,000 Units by mouth daily. Pt. Takes 2,000 units dialy     lisdexamfetamine (VYVANSE) 70 MG capsule Take 1 capsule (70 mg total) by mouth daily. 30 capsule 0   lisdexamfetamine (VYVANSE) 70 MG capsule Take 1 capsule (70 mg total) by mouth daily. 30 capsule 0   lisdexamfetamine (VYVANSE) 70 MG capsule Take 1 capsule (70 mg total) by mouth daily. 30 capsule 0   norethindrone (MICRONOR) 0.35 MG tablet Take 1 tablet (0.35 mg total) by mouth every morning. 28 tablet 5   omeprazole (PRILOSEC) 20 MG capsule Take 20 mg by mouth daily.     ondansetron (ZOFRAN) 8 MG tablet Take 1 tablet (8 mg total) by mouth every 8 (eight) hours as needed for nausea or vomiting. 30 tablet 5   No current facility-administered medications for this visit.   No results found.  Review of Systems:   A ROS  was performed including pertinent positives and negatives as documented in the HPI.  Physical Exam :   Constitutional: NAD and appears stated age Neurological: Alert and oriented Psych: Appropriate affect and cooperative There were no vitals taken for this visit.   Comprehensive Musculoskeletal Exam:    Left knee exam demonstrates active range of motion from 5 to 130 degrees.  No notable effusion.  Some tenderness over the medial joint line.  Stable collaterals with varus and valgus stress.  Negative Lachman.  Left ankle exam demonstrates mild lateral soft tissue swelling.  No tenderness over the medial or lateral malleolus, lateral ankle ligaments, or Achilles.  Negative Thompson test.  Positive anterior drawer  and talar tilt tests.  Dorsalis pedis 2+.  Imaging:   Xray (left knee 4 views): Negative for bony abnormality  Xray (left ankle 3 views): No acute bony abnormality.  Plantar calcaneal spur.   I personally reviewed and interpreted the radiographs.   Assessment and Plan:    Ankle Injury   Persistent pain and clicking since January with noted laxity on physical exam. X-ray shows no acute findings. Plan to obtain MRI of the ankle to evaluate ligamentous integrity.  Discussed that she would likely be a good candidate for physical therapy to strengthen the ankle should MRI indicate nonoperative management.  Knee Injury   Persistent pain since January, especially with twisting motions. No instability or locking however she has been experiencing some clicking. X-ray shows no acute findings. Order MRI to evaluate for possible meniscal injury. Return to clinic shortly after for review and treatment discussion.         I personally saw and evaluated the patient, and participated in the management and treatment plan.  Hazle Nordmann, PA-C Orthopedics

## 2023-11-25 ENCOUNTER — Encounter (HOSPITAL_BASED_OUTPATIENT_CLINIC_OR_DEPARTMENT_OTHER): Payer: Self-pay | Admitting: Student

## 2023-11-26 ENCOUNTER — Ambulatory Visit (HOSPITAL_BASED_OUTPATIENT_CLINIC_OR_DEPARTMENT_OTHER): Payer: Self-pay | Admitting: Orthopaedic Surgery

## 2023-12-03 ENCOUNTER — Ambulatory Visit
Admission: RE | Admit: 2023-12-03 | Discharge: 2023-12-03 | Disposition: A | Discharge status: Home / Self Care | Source: Ambulatory Visit | Attending: Student | Admitting: Student

## 2023-12-03 DIAGNOSIS — M76822 Posterior tibial tendinitis, left leg: Secondary | ICD-10-CM | POA: Diagnosis not present

## 2023-12-03 DIAGNOSIS — M25562 Pain in left knee: Secondary | ICD-10-CM

## 2023-12-03 DIAGNOSIS — M25462 Effusion, left knee: Secondary | ICD-10-CM | POA: Diagnosis not present

## 2023-12-03 DIAGNOSIS — M25572 Pain in left ankle and joints of left foot: Secondary | ICD-10-CM | POA: Diagnosis not present

## 2023-12-03 DIAGNOSIS — M722 Plantar fascial fibromatosis: Secondary | ICD-10-CM | POA: Diagnosis not present

## 2023-12-04 ENCOUNTER — Other Ambulatory Visit (HOSPITAL_BASED_OUTPATIENT_CLINIC_OR_DEPARTMENT_OTHER): Payer: Self-pay

## 2023-12-04 ENCOUNTER — Ambulatory Visit (HOSPITAL_BASED_OUTPATIENT_CLINIC_OR_DEPARTMENT_OTHER): Payer: Self-pay | Admitting: Orthopaedic Surgery

## 2023-12-04 ENCOUNTER — Ambulatory Visit (INDEPENDENT_AMBULATORY_CARE_PROVIDER_SITE_OTHER): Admitting: Orthopaedic Surgery

## 2023-12-04 DIAGNOSIS — M25372 Other instability, left ankle: Secondary | ICD-10-CM

## 2023-12-04 MED ORDER — OXYCODONE HCL 5 MG PO TABS
5.0000 mg | ORAL_TABLET | ORAL | 0 refills | Status: DC | PRN
  Filled 2023-12-04: qty 10, 2d supply, fill #0

## 2023-12-04 MED ORDER — IBUPROFEN 800 MG PO TABS
800.0000 mg | ORAL_TABLET | Freq: Three times a day (TID) | ORAL | 0 refills | Status: AC
  Filled 2023-12-04: qty 30, 10d supply, fill #0

## 2023-12-04 MED ORDER — ASPIRIN 325 MG PO TBEC
325.0000 mg | DELAYED_RELEASE_TABLET | Freq: Every day | ORAL | 0 refills | Status: DC
  Filled 2023-12-04: qty 14, 14d supply, fill #0

## 2023-12-04 MED ORDER — ACETAMINOPHEN 500 MG PO TABS
500.0000 mg | ORAL_TABLET | Freq: Three times a day (TID) | ORAL | 0 refills | Status: AC
  Filled 2023-12-04: qty 30, 10d supply, fill #0

## 2023-12-04 NOTE — Progress Notes (Signed)
 Chief Complaint: Left knee and ankle pain        History of Present Illness:    12/04/2023: Presents today for MRI follow-up of her left ankle and left knee   Jessica Graham is a 38 y.o. female presenting with left knee and ankle pain following a skateboarding injury. She has been experiencing knee and ankle pain since January 3rd after a skateboarding incident where she landed awkwardly. Initially, she suspected a broken ankle due to the pain but assumed it was a sprain as she could still walk. She has been following RICE protocols since the injury. The ankle was initially more painful, swollen, and bruised, leading her to focus on it more than the knee.   Over time, the knee pain has worsened, especially after activities like gardening, causing significant soreness that wakes her at night. The knee does not swell noticeably, nor does it buckle or give out, but it is painful with twisting motions and sometimes clicks. She occasionally takes Advil for pain, particularly when it disrupts her sleep. She has a history of previous ankle injuries, including a childhood injury, and mentions that her ankle has always been somewhat unstable, twisting easily. 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 She engages in activities like skateboarding and gardening, which have been impacted by her current injuries.   Surgical History:   None   PMH/PSH/Family History/Social History/Meds/Allergies:         Past Medical History:  Diagnosis Date   ADD (attention deficit disorder)     Anxiety     Depression     Fatigue 10/30/2021   Gestational hypertension     Poor motivation 01/30/2023   Recent skin changes 12/15/2022   Tachycardia, unspecified 02/23/2023             Past Surgical History:  Procedure Laterality Date   LAPAROSCOPIC APPENDECTOMY N/A 10/19/2020    Procedure: APPENDECTOMY LAPAROSCOPIC;  Surgeon: Axel Filler, MD;  Location:  [facility] OR;  Service: General;  Laterality: N/A;   TONSILLECTOMY   2008        Social History         Socioeconomic History   Marital status: Married      Spouse name: Not on file   Number of children: Not on file   Years of education: Not on file   Highest education level: Not on file  Occupational History   Not on file  Tobacco Use   Smoking status: Never   Smokeless tobacco: Never  Substance and Sexual Activity   Alcohol use: Yes   Drug use: Never   Sexual activity: Yes      Birth control/protection: Pill  Other Topics Concern   Not on file  Social History Narrative   Not on file    Social Drivers of Health        Financial Resource Strain: Low Risk  (12/01/2019)    Received from [facility], [facility]    Overall Financial Resource Strain (CARDIA)     Difficulty of Paying Living Expenses: Not hard at all  Food Insecurity: No Food Insecurity (12/01/2019)    Received from [facility], [facility]    Hunger Vital Sign     Worried About Running Out of Food in the Last  Year: Never true     Ran Out of Food in the Last Year: Never true  Transportation Needs: No Transportation Needs (12/01/2019)    Received from [facility], [facility]    [facility] - Transportation     Lack of Transportation (Medical): No     Lack of Transportation (Non-Medical): No  Physical Activity: Insufficiently Active (12/01/2019)    Received from [facility], [facility]    Exercise Vital Sign     Days of Exercise per Week: 4 days     Minutes of Exercise per Session: 20 min  Stress: No Stress Concern Present (12/01/2019)    [facility], [facility] - Occupational Stress Questionnaire     Feeling of Stress : Not at all  Social Connections: Unknown (12/01/2019)    Received from [facility], [facility]    Social Connection and Isolation Panel [NHANES]     Frequency of Communication with Friends and  Family: Once a week     Frequency of Social Gatherings with Friends and Family: Once a week     Attends Religious Services: Never     Database administrator or Organizations: No     Attends Banker Meetings: Never     Marital Status: Not on file    No family history on file.     Allergies  No Known Allergies         Current Outpatient Medications  Medication Sig Dispense Refill   amphetamine-dextroamphetamine (ADDERALL) 20 MG tablet Take 1 tablet (20 mg total) by mouth 2 (two) times daily. 60 tablet 0   amphetamine-dextroamphetamine (ADDERALL) 20 MG tablet Take 1 tablet (20 mg total) by mouth 2 (two) times daily. 60 tablet 0   amphetamine-dextroamphetamine (ADDERALL) 20 MG tablet Take 1 tablet (20 mg total) by mouth 2 (two) times daily. 60 tablet 0   amphetamine-dextroamphetamine (ADDERALL) 20 MG tablet Take 1 tablet (20 mg total) by mouth 2 (two) times daily. 60 tablet 0   budesonide-formoterol (SYMBICORT) 80-4.5 MCG/ACT inhaler Inhale 2 puffs into the lungs 2 (two) times daily. 1 each 5   buPROPion (WELLBUTRIN XL) 150 MG 24 hr tablet Take 2 tablets (300 mg total) by mouth daily. 180 tablet 3   cholecalciferol (VITAMIN D3) 25 MCG (1000 UNIT) tablet Take 1,000 Units by mouth daily. Pt. Takes 2,000 units dialy       lisdexamfetamine (VYVANSE) 70 MG capsule Take 1 capsule (70 mg total) by mouth daily. 30 capsule 0   lisdexamfetamine (VYVANSE) 70 MG capsule Take 1 capsule (70 mg total) by mouth daily. 30 capsule 0   lisdexamfetamine (VYVANSE) 70 MG capsule Take 1 capsule (70 mg total) by mouth daily. 30 capsule 0   norethindrone (MICRONOR) 0.35 MG tablet Take 1 tablet (0.35 mg total) by mouth every morning. 28 tablet 5   omeprazole (PRILOSEC) 20 MG capsule Take 20 mg by mouth daily.       ondansetron (ZOFRAN) 8 MG tablet Take 1 tablet (8 mg total) by mouth every 8 (eight) hours as needed for nausea or vomiting. 30 tablet 5      No current facility-administered medications for  this visit.      Imaging Results (Last 48 hours)  No results found.     Review of Systems:   A ROS was performed including pertinent positives and negatives as documented in the HPI.  Physical Exam :   Constitutional: NAD and appears stated age Neurological: Alert and oriented Psych: Appropriate affect and cooperative There were no vitals taken for this visit.    Comprehensive Musculoskeletal Exam:     Left knee exam demonstrates active range of motion from 5 to 130 degrees.  No notable effusion.  Some tenderness over the medial joint line.  Stable collaterals with varus and valgus stress.  Negative Lachman.   Left ankle exam demonstrates mild lateral soft tissue swelling.  No tenderness over the medial or lateral malleolus, lateral ankle ligaments, or Achilles.  Negative Thompson test.  Positive anterior drawer and talar tilt tests.  Dorsalis pedis 2+.   Imaging:   Xray (left knee 4 views): Negative for bony abnormality   Xray (left ankle 3 views): No acute bony abnormality.  Plantar calcaneal spur.  MRI left knee, left ankle: Chronic appearing ATFL lateral ligamentous injury about the ankle, normal cartilage.  No evidence of knee injury  I personally reviewed and interpreted the radiographs.     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  She is having a hard time going up and down stairs.  She is having a very difficult time skateboarding as a result of her injury.  She has trialed bracing as well as strengthening of the ankle without any relief.  Given this I did discuss the role for a Brostrm with internal brace.  I did discuss the risks and limitations associated with this.  After discussion of all of this she has ultimately elected for left ankle repair with internal brace placement.  I do not see any evidence of a chondral injury and as result did  not feel that arthroscopy is needed.  With regard to the left knee I did discuss that this is more of an LCL type sprain which I do believe will improve significantly.  With regard to her right thumb nail based pain I do believe she might have a mucoid cyst which is causing pain below the nailbed.  I will plan to make a referral to my partner Dr. Drucie Ip for discussion and options of this  -Plan for left ankle Brostrm repair with internal brace placement   After a lengthy discussion of treatment options, including risks, benefits, alternatives, complications of surgical and nonsurgical conservative options, the patient elected surgical repair.   The patient  is aware of the material risks  and complications including, but not limited to injury to adjacent structures, neurovascular injury, infection, numbness, bleeding, implant failure, thermal burns, stiffness, persistent pain, failure to heal, disease transmission from allograft, need for further surgery, dislocation, anesthetic risks, blood clots, risks of death,and others. The probabilities of surgical success and failure discussed with patient given their particular co-morbidities.The time and nature of expected rehabilitation and recovery was discussed.The patient's questions were all answered preoperatively.  No barriers to understanding were noted. I explained the natural history of the disease process and Rx rationale.  I explained to the patient what I considered to be reasonable expectations given their personal situation.  The final treatment plan was arrived at through a shared patient decision making process model.      I personally saw and evaluated the patient, and participated in the management and treatment plan.

## 2023-12-11 ENCOUNTER — Other Ambulatory Visit: Payer: Self-pay | Admitting: Nurse Practitioner

## 2023-12-11 ENCOUNTER — Telehealth: Payer: Self-pay | Admitting: Nurse Practitioner

## 2023-12-11 ENCOUNTER — Encounter: Payer: Self-pay | Admitting: Nurse Practitioner

## 2023-12-11 ENCOUNTER — Ambulatory Visit: Admitting: Nurse Practitioner

## 2023-12-11 ENCOUNTER — Other Ambulatory Visit: Payer: Self-pay

## 2023-12-11 ENCOUNTER — Other Ambulatory Visit (HOSPITAL_BASED_OUTPATIENT_CLINIC_OR_DEPARTMENT_OTHER): Payer: Self-pay

## 2023-12-11 VITALS — BP 122/82 | HR 123 | Ht 66.0 in | Wt 158.4 lb

## 2023-12-11 DIAGNOSIS — Z3041 Encounter for surveillance of contraceptive pills: Secondary | ICD-10-CM

## 2023-12-11 DIAGNOSIS — Z13228 Encounter for screening for other metabolic disorders: Secondary | ICD-10-CM

## 2023-12-11 DIAGNOSIS — Z0000 Encounter for general adult medical examination without abnormal findings: Secondary | ICD-10-CM | POA: Diagnosis not present

## 2023-12-11 DIAGNOSIS — Z1329 Encounter for screening for other suspected endocrine disorder: Secondary | ICD-10-CM | POA: Diagnosis not present

## 2023-12-11 DIAGNOSIS — S99912A Unspecified injury of left ankle, initial encounter: Secondary | ICD-10-CM

## 2023-12-11 DIAGNOSIS — F900 Attention-deficit hyperactivity disorder, predominantly inattentive type: Secondary | ICD-10-CM

## 2023-12-11 DIAGNOSIS — N63 Unspecified lump in unspecified breast: Secondary | ICD-10-CM | POA: Diagnosis not present

## 2023-12-11 DIAGNOSIS — Z1321 Encounter for screening for nutritional disorder: Secondary | ICD-10-CM | POA: Diagnosis not present

## 2023-12-11 DIAGNOSIS — Z13220 Encounter for screening for lipoid disorders: Secondary | ICD-10-CM

## 2023-12-11 DIAGNOSIS — Z130 Encounter for screening for diseases of the blood and blood-forming organs and certain disorders involving the immune mechanism: Secondary | ICD-10-CM

## 2023-12-11 MED ORDER — LISDEXAMFETAMINE DIMESYLATE 70 MG PO CAPS: 70.0000 mg | ORAL_CAPSULE | Freq: Every day | ORAL | 0 refills | Status: DC

## 2023-12-11 MED ORDER — AMPHETAMINE-DEXTROAMPHETAMINE 20 MG PO TABS: 20.0000 mg | ORAL_TABLET | Freq: Two times a day (BID) | ORAL | 0 refills | Status: DC

## 2023-12-11 MED ORDER — AMPHETAMINE-DEXTROAMPHETAMINE 20 MG PO TABS
20.0000 mg | ORAL_TABLET | Freq: Two times a day (BID) | ORAL | 0 refills | Status: DC
Start: 2023-12-11 — End: 2024-01-11

## 2023-12-11 MED ORDER — NORETHINDRONE 0.35 MG PO TABS: 1.0000 | ORAL_TABLET | Freq: Every morning | ORAL | 3 refills | Status: DC

## 2023-12-11 NOTE — Patient Instructions (Addendum)
 I have sent in the order for a breast ultrasound and possible mammogram. They will contact you to schedule this.   I have also sent in refills of your medications. If you have any issues please let me know.

## 2023-12-11 NOTE — Progress Notes (Signed)
 Shawna Clamp, DNP, AGNP-c [facility] [address] [place], [place] [address] Main Office [phone]  BP 122/82   Pulse (!) 123   Ht 5\' 6"  (1.676 m)   Wt 158 lb 6.4 oz (71.8 kg)   LMP 11/16/2023 (Within Days)   BMI 25.57 kg/m    Subjective:    Patient ID: Jessica Graham, female    DOB: 05-26-1986, 38 y.o.   MRN: [id number]  HPI: Jessica Graham is a 38 y.o. female presenting on 12/11/2023 for comprehensive medical examination.   History of Present Illness Jessica Graham is a 38 year old female who presents with a new breast lump.  She noticed a new lump in her left breast while showering, which feels different from her usual nodular breast tissue. The lump is described as a 'little frozen pea' and is not associated with tenderness, skin changes, or discharge. She has no history of BRCA gene mutations. No weight changes or night sweats are reported.  She is scheduled for ankle surgery on January 07, 2024, due to chronic tearing of a ligament in her ankle, a condition that has persisted since a skateboarding incident in January. She experiences frequent ankle rolling, which she attributes to a childhood injury.  She is actively involved in gardening and is working on a free produce program with a friend. Her husband is a nephrologist, and she prioritizes family time. She has a four-year-old daughter who enjoys playing outside.  Pertinent items are noted in HPI.    Most Recent Depression Screen:     12/11/2023   10:41 AM 06/30/2023    1:25 PM 06/30/2023    1:24 PM 01/30/2023   10:34 AM 04/18/2022   10:05 AM  Depression screen PHQ 2/9  Decreased Interest 0 0 0 1 0  Down, Depressed, Hopeless 0 0 0 1 0  PHQ - 2 Score 0 0 0 2 0  Altered sleeping  3  3 0  Tired, decreased energy  2  3 0  Change in appetite  1  0 0  Feeling bad or failure about yourself   0  1 0  Trouble concentrating  3  3 0  Moving slowly or fidgety/restless  1  0 0  Suicidal thoughts  0  0 0   PHQ-9 Score  10  12 0  Difficult doing work/chores  Somewhat difficult  Somewhat difficult Not difficult at all   Most Recent Anxiety Screen:     10/30/2021    2:00 PM  GAD 7 : Generalized Anxiety Score  Nervous, Anxious, on Edge 1  Control/stop worrying 0  Worry too much - different things 0  Trouble relaxing 0  Restless 1  Easily annoyed or irritable 1  Afraid - awful might happen 0  Total GAD 7 Score 3  Anxiety Difficulty Not difficult at all   Most Recent Fall Screen:    12/11/2023   10:40 AM 04/18/2022   10:04 AM 10/30/2021   10:41 AM  Fall Risk   Falls in the past year? 1 1 0  Number falls in past yr: 0 0 0  Injury with Fall? 1 1 0  Risk for fall due to : No Fall Risks No Fall Risks No Fall Risks  Follow up Falls evaluation completed Falls evaluation completed Falls evaluation completed    Past medical history, surgical history, medications, allergies, family history and social history reviewed with patient today and changes made to appropriate areas of the chart.  Past Medical History:  Past Medical History:  Diagnosis Date   ADD (attention deficit disorder)    Anxiety    Depression    Fatigue 10/30/2021   Gestational hypertension    Poor motivation 01/30/2023   Recent skin changes 12/15/2022   Tachycardia, unspecified 02/23/2023   Medications:  Current Outpatient Medications on File Prior to Visit  Medication Sig   budesonide-formoterol (SYMBICORT) 80-4.5 MCG/ACT inhaler Inhale 2 puffs into the lungs 2 (two) times daily.   buPROPion (WELLBUTRIN XL) 150 MG 24 hr tablet Take 2 tablets (300 mg total) by mouth daily.   cholecalciferol (VITAMIN D3) 25 MCG (1000 UNIT) tablet Take 1,000 Units by mouth daily. Pt. Takes 2,000 units dialy   omeprazole (PRILOSEC) 20 MG capsule Take 20 mg by mouth daily.   ondansetron (ZOFRAN) 8 MG tablet Take 1 tablet (8 mg total) by mouth every 8 (eight) hours as needed for nausea or vomiting.   acetaminophen (TYLENOL) 500 MG tablet  Take 1 tablet (500 mg total) by mouth every 8 (eight) hours for 10 days. (Patient not taking: Reported on 12/11/2023)   aspirin EC 325 MG tablet Take 1 tablet (325 mg total) by mouth daily. (Patient not taking: Reported on 12/11/2023)   ibuprofen (ADVIL) 800 MG tablet Take 1 tablet (800 mg total) by mouth every 8 (eight) hours for 10 days. Please take with food, please alternate with acetaminophen (Patient not taking: Reported on 12/11/2023)   oxyCODONE (ROXICODONE) 5 MG immediate release tablet Take 1 tablet (5 mg total) by mouth every 4 (four) hours as needed for severe pain (pain score 7-10) or breakthrough pain. (Patient not taking: Reported on 12/11/2023)   No current facility-administered medications on file prior to visit.   Surgical History:  Past Surgical History:  Procedure Laterality Date   LAPAROSCOPIC APPENDECTOMY N/A 10/19/2020   Procedure: APPENDECTOMY LAPAROSCOPIC;  Surgeon: Axel Filler, MD;  Location: [facility] OR;  Service: General;  Laterality: N/A;   TONSILLECTOMY  2008   Allergies:  No Known Allergies Family History:  No family history on file.     Objective:    BP 122/82   Pulse (!) 123   Ht 5\' 6"  (1.676 m)   Wt 158 lb 6.4 oz (71.8 kg)   LMP 11/16/2023 (Within Days)   BMI 25.57 kg/m   Wt Readings from Last 3 Encounters:  12/11/23 158 lb 6.4 oz (71.8 kg)  06/30/23 155 lb (70.3 kg)  03/13/23 159 lb (72.1 kg)    Physical Exam Constitutional:      General: She is not in acute distress.    Appearance: Normal appearance. She is not ill-appearing.  HENT:     Head: Normocephalic.     Right Ear: Tympanic membrane normal.     Left Ear: Tympanic membrane normal.     Nose: Nose normal.     Mouth/Throat:     Mouth: Mucous membranes are moist.     Pharynx: Oropharynx is clear.  Eyes:     Conjunctiva/sclera: Conjunctivae normal.  Neck:     Vascular: No carotid bruit.  Cardiovascular:     Rate and Rhythm: Normal rate and regular rhythm.     Pulses: Normal pulses.      Heart sounds: Normal heart sounds.  Pulmonary:     Effort: Pulmonary effort is normal.     Breath sounds: Normal breath sounds.  Chest:     Chest wall: No mass, swelling, tenderness or edema.  Breasts:    Tanner Score is 5.  Breasts are symmetrical.     Right: Normal.     Left: No nipple discharge, skin change or tenderness.       Comments: Nodularity noted at the 7 oclock position of the left breast proximal to the nipple. Breasts are diffusely nodular.  Abdominal:     General: Abdomen is flat. Bowel sounds are normal. There is no distension.     Palpations: Abdomen is soft.     Tenderness: There is no abdominal tenderness. There is no right CVA tenderness or guarding.  Musculoskeletal:        General: Normal range of motion.     Cervical back: Normal range of motion and neck supple.     Right lower leg: No edema.     Left lower leg: No edema.  Lymphadenopathy:     Cervical: No cervical adenopathy.     Upper Body:     Right upper body: No supraclavicular, axillary or pectoral adenopathy.     Left upper body: No supraclavicular, axillary or pectoral adenopathy.  Skin:    General: Skin is warm and dry.     Capillary Refill: Capillary refill takes less than 2 seconds.  Neurological:     Mental Status: She is alert and oriented to person, place, and time.  Psychiatric:        Mood and Affect: Mood normal.        Behavior: Behavior normal.      Results for orders placed or performed in visit on 12/11/23  CBC with Differential/Platelet   Collection Time: 12/11/23 11:47 AM  Result Value Ref Range   WBC 6.4 3.4 - 10.8 x10E3/uL   RBC 3.94 3.77 - 5.28 x10E6/uL   Hemoglobin 12.1 11.1 - 15.9 g/dL   Hematocrit 62.1 30.8 - 46.6 %   MCV 96 79 - 97 fL   MCH 30.7 26.6 - 33.0 pg   MCHC 31.9 31.5 - 35.7 g/dL   RDW 65.7 84.6 - 96.2 %   Platelets 330 150 - 450 x10E3/uL   Neutrophils 54 Not Estab. %   Lymphs 36 Not Estab. %   Monocytes 6 Not Estab. %   Eos 3 Not Estab. %    Basos 1 Not Estab. %   Neutrophils Absolute 3.5 1.4 - 7.0 x10E3/uL   Lymphocytes Absolute 2.3 0.7 - 3.1 x10E3/uL   Monocytes Absolute 0.4 0.1 - 0.9 x10E3/uL   EOS (ABSOLUTE) 0.2 0.0 - 0.4 x10E3/uL   Basophils Absolute 0.1 0.0 - 0.2 x10E3/uL   Immature Granulocytes 0 Not Estab. %   Immature Grans (Abs) 0.0 0.0 - 0.1 x10E3/uL  Comprehensive metabolic panel with GFR   Collection Time: 12/11/23 11:47 AM  Result Value Ref Range   Glucose 95 70 - 99 mg/dL   BUN 14 6 - 20 mg/dL   Creatinine, Ser 9.52 0.57 - 1.00 mg/dL   eGFR 84 >84 XL/KGM/0.10   BUN/Creatinine Ratio 15 9 - 23   Sodium 138 134 - 144 mmol/L   Potassium 5.0 3.5 - 5.2 mmol/L   Chloride 105 96 - 106 mmol/L   CO2 20 20 - 29 mmol/L   Calcium 9.4 8.7 - 10.2 mg/dL   Total Protein 6.9 6.0 - 8.5 g/dL   Albumin 4.5 3.9 - 4.9 g/dL   Globulin, Total 2.4 1.5 - 4.5 g/dL   Bilirubin Total <2.7 0.0 - 1.2 mg/dL   Alkaline Phosphatase 84 44 - 121 IU/L   AST 20 0 - 40 IU/L   ALT 16 0 -  32 IU/L  VITAMIN D 25 Hydroxy (Vit-D Deficiency, Fractures)   Collection Time: 12/11/23 11:47 AM  Result Value Ref Range   Vit D, 25-Hydroxy 57.9 30.0 - 100.0 ng/mL  LP+LDL Direct   Collection Time: 12/11/23 11:47 AM  Result Value Ref Range   Cholesterol, Total 165 100 - 199 mg/dL   Triglycerides 161 0 - 149 mg/dL   HDL 42 >09 mg/dL   VLDL Cholesterol Cal 21 5 - 40 mg/dL   LDL Chol Calc (NIH) 604 (H) 0 - 99 mg/dL   LDL CALC COMMENT: Comment    LDL Direct 117 (H) 0 - 99 mg/dL       Assessment & Plan:   Problem List Items Addressed This Visit     Attention deficit hyperactivity disorder, predominantly inattentive type   Well controlled on current regimen. Refills provided. PDMP reviewed with no concerns.       Relevant Medications   lisdexamfetamine (VYVANSE) 70 MG capsule (Start on 01/08/2024)   lisdexamfetamine (VYVANSE) 70 MG capsule (Start on 02/05/2024)   lisdexamfetamine (VYVANSE) 70 MG capsule   amphetamine-dextroamphetamine (ADDERALL)  20 MG tablet   amphetamine-dextroamphetamine (ADDERALL) 20 MG tablet (Start on 01/08/2024)   amphetamine-dextroamphetamine (ADDERALL) 20 MG tablet (Start on 02/05/2024)   Encounter for annual physical exam   CPE completed today. Review of HM activities and recommendations discussed and provided on AVS. Anticipatory guidance, diet, and exercise recommendations provided. Medications, allergies, and hx reviewed and updated as necessary. Orders placed as listed below.  Plan: - Labs ordered. Will make changes as necessary based on results.  - I will review these results and send recommendations via MyChart or a telephone call.  - F/U with CPE in 1 year or sooner for acute/chronic health needs as directed.        Relevant Orders   CBC with Differential/Platelet (Completed)   Comprehensive metabolic panel with GFR (Completed)   VITAMIN D 25 Hydroxy (Vit-D Deficiency, Fractures) (Completed)   Breast nodule - Primary   A new nodular lump, similar to a frozen pea, was detected in the left breast without tenderness, skin changes, or discharge. Given her history of nodular breasts, it is likely part of the nodularity, but further evaluation is necessary to rule out serious conditions. 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 She experiences frequent ankle rolling and has a sprained LCL in the knee. Surgical intervention is planned to address the instability. - Proceed with scheduled ankle surgery on January 07, 2024      Other Visit Diagnoses       Screening for endocrine, nutritional, metabolic and immunity disorder       Relevant Orders   CBC with Differential/Platelet (Completed)   Comprehensive metabolic panel with GFR  (Completed)   VITAMIN D 25 Hydroxy (Vit-D Deficiency, Fractures) (Completed)     Encounter for surveillance of contraceptive pills       Relevant Medications   norethindrone (MICRONOR) 0.35 MG tablet     Screening for lipid disorders       Relevant Orders   LP+LDL Direct (Completed)         Follow up plan: Return in about 1 year (around 12/10/2024) for CPE, Med Management 30.  NEXT PREVENTATIVE  PHYSICAL DUE IN 1 YEAR.  PATIENT COUNSELING PROVIDED FOR ALL ADULT PATIENTS: A well balanced diet low in saturated fats, cholesterol, and moderation in carbohydrates.  This can be as simple as monitoring portion sizes and cutting back on sugary beverages such as soda and juice to start with.    Daily water consumption of at least 64 ounces.  Physical activity at least 180 minutes per week.  If just starting out, start 10 minutes a day and work your way up.   This can be as simple as taking the stairs instead of the elevator and walking 2-3 laps around the office  purposefully every day.   STD protection, partner selection, and regular testing if high risk.  Limited consumption of alcoholic beverages if alcohol is consumed. For men, I recommend no more than 14 alcoholic beverages per week, spread out throughout the week (max 2 per day). Avoid "binge" drinking or consuming large quantities of alcohol in one setting.  Please let me know if you feel you may need help with reduction or quitting alcohol consumption.   Avoidance of nicotine, if used. Please let me know if you feel you may need help with reduction or quitting nicotine use.   Daily mental health attention. This can be in the form of 5 minute daily meditation, prayer, journaling, yoga, reflection, etc.  Purposeful attention to your emotions and mental state can significantly improve your overall wellbeing  and  Health.  Please know that I am here to help you with all of your health care goals and am happy to work with you to  find a solution that works best for you.  The greatest advice I have received with any changes in life are to take it one step at a time, that even means if all you can focus on is the next 60 seconds, then do that and celebrate your victories.  With any changes in life, you will have set backs, and that is OK. The important thing to remember is, if you have a set back, it is not a failure, it is an opportunity to try again! Screening Testing Mammogram Every 1 -2 years based on history and risk factors Starting at age 56 Pap Smear Ages 21-39 every 3 years Ages 49-65 every 5 years with HPV testing More frequent testing may be required based on results and history Colon Cancer Screening Every 1-10 years based on test performed, risk factors, and history Starting at age 35 Bone Density Screening Every 2-10 years based on history Starting at age 58 for women Recommendations for men differ based on medication usage, history, and risk factors AAA Screening One time ultrasound Men 57-61 years old who have every smoked Lung Cancer Screening Low Dose Lung CT every 12 months Age 65-80 years with a 30 pack-year smoking history who still smoke or who have quit within the last 15 years   Screening Labs Routine  Labs: Complete Blood Count (CBC), Complete Metabolic Panel (CMP), Cholesterol (Lipid Panel) Every 6-12 months based on history and medications May be recommended more frequently based on current conditions or previous results Hemoglobin A1c Lab Every 3-12 months based on history and previous results Starting at age 75 or earlier with diagnosis of diabetes, high cholesterol, BMI >26, and/or risk factors Frequent monitoring for patients with diabetes to ensure blood sugar control Thyroid Panel (TSH) Every 6 months based on history, symptoms, and risk factors May be repeated more often if on medication HIV One time testing for  all patients 74 and older May be repeated more frequently  for patients with increased risk factors or exposure Hepatitis C One time testing for all patients 76 and older May be repeated more frequently for patients with increased risk factors or exposure Gonorrhea, Chlamydia Every 12 months for all sexually active persons 13-24 years Additional monitoring may be recommended for those who are considered high risk or who have symptoms Every 12 months for any woman on birth control, regardless of sexual activity PSA Men 13-32 years old with risk factors Additional screening may be recommended from age 52-69 based on risk factors, symptoms, and history  Vaccine Recommendations Tetanus Booster All adults every 10 years Flu Vaccine All patients 6 months and older every year COVID Vaccine All patients 12 years and older Initial dosing with booster May recommend additional booster based on age and health history HPV Vaccine 2 doses all patients age 69-26 Dosing may be considered for patients over 26 Shingles Vaccine (Shingrix) 2 doses all adults 55 years and older Pneumonia (Pneumovax 58) All adults 65 years and older May recommend earlier dosing based on health history One year apart from Prevnar 70 Pneumonia (Prevnar 106) All adults 65 years and older Dosed 1 year after Pneumovax 23 Pneumonia (Prevnar 20) One time alternative to the two dosing of 13 and 23 For all adults with initial dose of 23, 20 is recommended 1 year later For all adults with initial dose of 13, 23 is still recommended as second option 1 year later

## 2023-12-11 NOTE — Telephone Encounter (Signed)
 Copied from CRM [id number]. Topic: Clinical - Medication Question >> Dec 11, 2023 11:40 AM Alessandra Bevels wrote: Reason for CRM: Marcelino Duster calling from [facility] is calling to ask if the patient is taking  lisdexamfetamine (VYVANSE) 70 MG capsule [[id number]] &  amphetamine-dextroamphetamine (ADDERALL) 20 MG tablet [[id number]]

## 2023-12-12 LAB — CBC WITH DIFFERENTIAL/PLATELET
Basophils Absolute: 0.1 10*3/uL (ref 0.0–0.2)
Basos: 1 %
EOS (ABSOLUTE): 0.2 10*3/uL (ref 0.0–0.4)
Eos: 3 %
Hematocrit: 37.9 % (ref 34.0–46.6)
Hemoglobin: 12.1 g/dL (ref 11.1–15.9)
Immature Grans (Abs): 0 10*3/uL (ref 0.0–0.1)
Immature Granulocytes: 0 %
Lymphocytes Absolute: 2.3 10*3/uL (ref 0.7–3.1)
Lymphs: 36 %
MCH: 30.7 pg (ref 26.6–33.0)
MCHC: 31.9 g/dL (ref 31.5–35.7)
MCV: 96 fL (ref 79–97)
Monocytes Absolute: 0.4 10*3/uL (ref 0.1–0.9)
Monocytes: 6 %
Neutrophils Absolute: 3.5 10*3/uL (ref 1.4–7.0)
Neutrophils: 54 %
Platelets: 330 10*3/uL (ref 150–450)
RBC: 3.94 x10E6/uL (ref 3.77–5.28)
RDW: 11.7 % (ref 11.7–15.4)
WBC: 6.4 10*3/uL (ref 3.4–10.8)

## 2023-12-12 LAB — LP+LDL DIRECT
Cholesterol, Total: 165 mg/dL (ref 100–199)
HDL: 42 mg/dL (ref >39)
LDL Chol Calc (NIH): 102 mg/dL — ABNORMAL HIGH (ref 0–99)
LDL Direct: 117 mg/dL — ABNORMAL HIGH (ref 0–99)
Triglycerides: 114 mg/dL (ref 0–149)
VLDL Cholesterol Cal: 21 mg/dL (ref 5–40)

## 2023-12-12 LAB — COMPREHENSIVE METABOLIC PANEL WITH GFR
ALT: 16 IU/L (ref 0–32)
AST: 20 IU/L (ref 0–40)
Albumin: 4.5 g/dL (ref 3.9–4.9)
Alkaline Phosphatase: 84 IU/L (ref 44–121)
BUN/Creatinine Ratio: 15 (ref 9–23)
BUN: 14 mg/dL (ref 6–20)
Bilirubin Total: <0.2 mg/dL (ref 0.0–1.2)
CO2: 20 mmol/L (ref 20–29)
Calcium: 9.4 mg/dL (ref 8.7–10.2)
Chloride: 105 mmol/L (ref 96–106)
Creatinine, Ser: 0.91 mg/dL (ref 0.57–1.00)
Globulin, Total: 2.4 g/dL (ref 1.5–4.5)
Glucose: 95 mg/dL (ref 70–99)
Potassium: 5 mmol/L (ref 3.5–5.2)
Sodium: 138 mmol/L (ref 134–144)
Total Protein: 6.9 g/dL (ref 6.0–8.5)
eGFR: 84 mL/min/{1.73_m2} (ref >59)

## 2023-12-12 LAB — VITAMIN D 25 HYDROXY (VIT D DEFICIENCY, FRACTURES): Vit D, 25-Hydroxy: 57.9 ng/mL (ref 30.0–100.0)

## 2023-12-14 ENCOUNTER — Encounter: Payer: Self-pay | Admitting: Nurse Practitioner

## 2023-12-14 DIAGNOSIS — S99912A Unspecified injury of left ankle, initial encounter: Secondary | ICD-10-CM | POA: Insufficient documentation

## 2023-12-14 NOTE — Assessment & Plan Note (Signed)
 A new nodular lump, similar to a frozen pea, was detected in the left breast without tenderness, skin changes, or discharge. Given her history of nodular breasts, it is likely part of the nodularity, but further evaluation is necessary to rule out serious conditions. - Order breast ultrasound - Consider mammogram based on ultrasound findings

## 2023-12-14 NOTE — Assessment & Plan Note (Signed)

## 2023-12-14 NOTE — Assessment & Plan Note (Signed)
 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 She experiences frequent ankle rolling and has a sprained LCL in the knee. Surgical intervention is planned to address the instability. - Proceed with scheduled ankle surgery on January 07, 2024

## 2023-12-14 NOTE — Assessment & Plan Note (Signed)
 Well controlled on current regimen. Refills provided. PDMP reviewed with no concerns.

## 2023-12-15 ENCOUNTER — Other Ambulatory Visit: Payer: Self-pay | Admitting: Nurse Practitioner

## 2023-12-15 DIAGNOSIS — N63 Unspecified lump in unspecified breast: Secondary | ICD-10-CM

## 2023-12-25 ENCOUNTER — Encounter (HOSPITAL_BASED_OUTPATIENT_CLINIC_OR_DEPARTMENT_OTHER): Payer: Self-pay | Admitting: Orthopaedic Surgery

## 2024-01-04 ENCOUNTER — Other Ambulatory Visit (INDEPENDENT_AMBULATORY_CARE_PROVIDER_SITE_OTHER)

## 2024-01-04 ENCOUNTER — Ambulatory Visit (INDEPENDENT_AMBULATORY_CARE_PROVIDER_SITE_OTHER): Admitting: Orthopedic Surgery

## 2024-01-04 DIAGNOSIS — M79645 Pain in left finger(s): Secondary | ICD-10-CM | POA: Diagnosis not present

## 2024-01-04 DIAGNOSIS — G8929 Other chronic pain: Secondary | ICD-10-CM

## 2024-01-04 DIAGNOSIS — M67449 Ganglion, unspecified hand: Secondary | ICD-10-CM | POA: Diagnosis not present

## 2024-01-04 NOTE — Progress Notes (Signed)
 Jessica Graham - 38 y.o. female MRN [id number]  Date of birth: 1986-02-16  Office Visit Note: Visit Date: 01/04/2024 PCP: Annella Kief, NP Referred by: Wilhelmenia Harada, MD  Subjective: No chief complaint on file.  HPI: Jessica Graham is a pleasant 38 y.o. female who presents today as a referral from Dr. Hermina Loosen for specific hand surgical evaluation of a potential left thumb mucous cyst.  She is having ongoing pain along the ulnar aspect of the proximal eponychial fold, denies any significant drainage from the region.  She has notable tenderness and sensitivity in this region.  Denies any prior infection or penetration injury to this area.  No prior interventions have been performed.  Pertinent ROS were reviewed with the patient and found to be negative unless otherwise specified above in HPI.   Visit Reason: left thumb mucous cyst Duration of symptoms: 1 year Hand dominance: right Occupation:photographer Diabetic: No Smoking: No Heart/Lung History: none Blood Thinners: none  Prior Testing/EMG: 12/2022 xrays Injections (Date): none Treatments: none Prior Surgery:none  Assessment & Plan: Visit Diagnoses:  1. Mucous cyst of digit of hand   2. Chronic pain of left thumb     Plan: Extensive discussion was had with the patient today regarding her left thumb.  Based on examination today, there is no significant cystic structure or mass appreciated.  She does have some tenderness underneath the eponychial fold which may be a product of the underlying cyst that is causing pain along the germinal matrix.  We discussed treatment modalities ranging from conservative to surgical.  From a conservative standpoint, we discussed ongoing observation, potential compression wrapping and nonsteroidal anti-inflammatory medication.  From a surgical standpoint, we discussed nail plate removal and potential cyst excision with exploration.  Given that there is no significant mass or fluid collection on  examination today, I did discuss with her in detail that the indication for surgery at this point would be somewhat minimal.  Given her ongoing symptoms, we could proceed with nail plate removal and exploration, however we may not encounter a significant mass or cyst to be excised at this region.  She does not have any significant underlying arthritis at the IP joint either.  Risks and benefits of the procedure were discussed, risks including but not limited to infection, bleeding, scarring, stiffness, nerve injury, tendon injury, vascular injury, ongoing nail plate deformity, mass recurrence, recurrence of symptoms and need for subsequent operation.  We also discussed the appropriate postoperative protocol and timeframe for return to activities and function.  Patient expressed understanding.    Understanding the above, for the time being we will take an observation approach.  She will return to me in the future should she notice regrowth or drainage from the area, she can also return for an increase in pain for repeat discussion regarding potential surgical management.  I spent 30 minutes in the care of this patient today including review of previous documentation, imaging obtained, face-to-face time discussing all options regarding treatment and documenting the encounter.   Follow-up: No follow-ups on file.   Meds & Orders: No orders of the defined types were placed in this encounter.   Orders Placed This Encounter  Procedures   XR Finger Thumb Left     Procedures: No procedures performed      Clinical History: No specialty comments available.  She reports that she has never smoked. She has never used smokeless tobacco. No results for input(s): "HGBA1C", "LABURIC" in the last 8760 hours.  Objective:   Vital Signs: There were no vitals taken for this visit.  Physical Exam  Gen: Well-appearing, in no acute distress; non-toxic CV: Regular Rate. Well-perfused. Warm.  Resp: Breathing  unlabored on room air; no wheezing. Psych: Fluid speech in conversation; appropriate affect; normal thought process  Ortho Exam Left thumb: - Nail plate well-seated beneath the eponychial fold, no evidence of discoloration or nail deformity - Able to perform range of motion of the IP joint, 0-45 without pain or restriction - Sensation is intact distally, normal color and capillary refill - There is moderate tenderness with deep palpation along the ulnar border of the eponychial fold, no evidence of fluid collection or significant mass, no expressible drainage  Imaging: XR Finger Thumb Left Result Date: 01/04/2024 X-rays of the left thumb without notable fracture or dislocation.  Normal bone mineralization.  There is notable flare of the bony distal phalanx along the ulnar aspect of the IP joint.   Past Medical/Family/Surgical/Social History: Medications & Allergies reviewed per EMR, new medications updated. Patient Active Problem List   Diagnosis Date Noted   Injury of left ankle 12/14/2023   Encounter for annual physical exam 12/11/2023   Breast nodule 12/11/2023   Asthmatic bronchitis 03/13/2023   Depression, recurrent (HCC) 02/23/2023   Leukocytosis 02/23/2023   Tremors of nervous system 12/15/2022   Pain of left thumb 12/15/2022   Breakthrough bleeding on birth control pills 10/30/2021   Attention deficit hyperactivity disorder, predominantly inattentive type 03/07/2015   Past Medical History:  Diagnosis Date   ADD (attention deficit disorder)    Anxiety    Depression    Fatigue 10/30/2021   Gestational hypertension    Poor motivation 01/30/2023   Recent skin changes 12/15/2022   Tachycardia, unspecified 02/23/2023   No family history on file. Past Surgical History:  Procedure Laterality Date   LAPAROSCOPIC APPENDECTOMY N/A 10/19/2020   Procedure: APPENDECTOMY LAPAROSCOPIC;  Surgeon: Shela Derby, MD;  Location: [facility];  Service: General;  Laterality: N/A;    TONSILLECTOMY  2008   Social History   Occupational History   Not on file  Tobacco Use   Smoking status: Never   Smokeless tobacco: Never  Substance and Sexual Activity   Alcohol use: Yes   Drug use: Never   Sexual activity: Yes    Birth control/protection: Pill    Yanet Balliet Alvia Jointer, M.D. [facility], Hand Surgery

## 2024-01-07 ENCOUNTER — Encounter: Payer: Self-pay | Admitting: Orthopaedic Surgery

## 2024-01-07 DIAGNOSIS — M25372 Other instability, left ankle: Secondary | ICD-10-CM | POA: Diagnosis not present

## 2024-01-07 DIAGNOSIS — M65872 Other synovitis and tenosynovitis, left ankle and foot: Secondary | ICD-10-CM | POA: Diagnosis not present

## 2024-01-07 DIAGNOSIS — G8918 Other acute postprocedural pain: Secondary | ICD-10-CM | POA: Diagnosis not present

## 2024-01-07 NOTE — Progress Notes (Signed)
   Date of Surgery: 01/07/2024  INDICATIONS: Ms. Jessica Graham is a 38 y.o.-year-old female with left ankle instability.  The risk and benefits of the procedure were discussed in detail and documented in the pre-operative evaluation.   PREOPERATIVE DIAGNOSIS: 1. Left ankle instability  POSTOPERATIVE DIAGNOSIS: Same.  PROCEDURE: 1. Left ankle Brostrom repair with internal brace  SURGEON: Carmina Chris MD  ASSISTANT: Deon Flatter, ATC  ANESTHESIA:  general  IV FLUIDS AND URINE: See anesthesia record.  ANTIBIOTICS: Ancef  ESTIMATED BLOOD LOSS: 10 mL.  DRAINS: None  CULTURES: None  COMPLICATIONS: none  DESCRIPTION OF PROCEDURE:       Patient was identified in the preoperative holding area.  The correct site was noted and marked according to universal protocol with nursing.  Anesthesia subsequently performed a peripheral nerve block.   The patient was subsequently taken back to the operating room and transferred over to the operating room table. Anesthesia was induced.  All bony prominences were padded.  He was prepped and draped in the usual sterile fashion.  Again timeout was held.  Ancef was given 1 hour prior to skin incision.     Attention was then turned to the lateral Brostrm approach. 15 blade was then used to incise through skin in the posterior lateral aspect of the fibula.  This was taken down to the level of bone.  A cuff of tissue was then released from the anterior lateral fibula.  The peroneal tendons were both inspected individually and there was a significant amount of synovitis that was removed with both tendons using a Metzenbaum scissors.  At this time the drill was used to place anchors in the anterior and posterior aspect of the fibula.  The suture component of this was then placed with a good bite through the AITF ligament and the knotless mechanism was each anchor was fed.  These were subsequently tensioned in tandem with the foot in dorsiflexion and eversion.   This showed excellent approximation of the tissue.  We then placed an internal brace first using the talar guide. This was placed onto the talus.  We drilled and tapped and then placed an anchor into the talus.  The same procedure was then used on the fibula just proximal to our previous all suture anchors with care to not over tension the suture.  This showed excellent reduction of the anterior lateral ankle with a subsequently negative anterior drawer negative talar tilt.   Wounds were thoroughly or irrigated.  0 Vicryl was used to approximate the posterior fibular tissue over the peroneal tendons.  The wound was closed with 3-0 nylon for the skin.       POSTOPERATIVE PLAN: Weight bearing as tolerated left leg in CAM boot. She will be placed on aspirin  for DVT prophylaxis. She will begin PT immediately postop.  Carmina Chris, MD 4:01 PM

## 2024-01-10 NOTE — Therapy (Signed)
 OUTPATIENT PHYSICAL THERAPY LOWER EXTREMITY EVALUATION   Patient Name: Jessica Graham MRN: [id number] DOB:1986/03/31, 38 y.o., female Today's Date: 01/12/2024  END OF SESSION:  PT End of Session - 01/11/24 1127     Visit Number 1    Number of Visits 26    Date for PT Re-Evaluation 04/04/24    Authorization Type BCBS    PT Start Time 1026    PT Stop Time 1119    PT Time Calculation (min) 53 min    Activity Tolerance Patient tolerated treatment well    Behavior During Therapy [facility] for tasks assessed/performed             Past Medical History:  Diagnosis Date   ADD (attention deficit disorder)    Anxiety    Depression    Fatigue 10/30/2021   Gestational hypertension    Poor motivation 01/30/2023   Recent skin changes 12/15/2022   Tachycardia, unspecified 02/23/2023   Past Surgical History:  Procedure Laterality Date   LAPAROSCOPIC APPENDECTOMY N/A 10/19/2020   Procedure: APPENDECTOMY LAPAROSCOPIC;  Surgeon: Shela Derby, MD;  Location: [facility] OR;  Service: General;  Laterality: N/A;   TONSILLECTOMY  2008   Patient Active Problem List   Diagnosis Date Noted   Injury of left ankle 12/14/2023   Encounter for annual physical exam 12/11/2023   Breast nodule 12/11/2023   Asthmatic bronchitis 03/13/2023   Depression, recurrent (HCC) 02/23/2023   Leukocytosis 02/23/2023   Tremors of nervous system 12/15/2022   Pain of left thumb 12/15/2022   Breakthrough bleeding on birth control pills 10/30/2021   Attention deficit hyperactivity disorder, predominantly inattentive type 03/07/2015     REFERRING PROVIDER: Wilhelmenia Harada, MD  REFERRING DIAG: [phone] (ICD-10-CM) - Other instability, left ankle  THERAPY DIAG:  Pain in left ankle and joints of left foot  Stiffness of left ankle, not elsewhere classified  Muscle weakness (generalized)  Difficulty in walking, not elsewhere classified  Rationale for Evaluation and Treatment: Rehabilitation  ONSET DATE: DOS  01/07/2024  SUBJECTIVE:   SUBJECTIVE STATEMENT: Pt has a Hx of previous ankle sprains.  Pt hopped off her skateboard on 1/3 and rolled her L ankle causing her to fall.  Pt had L knee and ankle pain and had x rays and MRI's.    Pt underwent Left ankle Brostrom repair with internal brace on 01/07/24.  Post-op plan on post-op note indicated Weight bearing as tolerated left leg in CAM boot.  She will begin PT immediately postop.  Pt walking mostly without crutches though has used crutches some in her home.  Pt has difficulty sleeping.  "Getting up and down is harder."  Pt is limited with her ADLs/IADLs and functional mobility skills.  She is limited with ambulation and is ambulating in boot.  Pt is unable to skateboard and garden.      PERTINENT HISTORY: Left ankle Brostrom repair with internal brace on 01/07/24 possible L knee LCL type sprain from 09/18/23 injury per MD note  ADD, mild depression, tachycardia which may be due to medication  PAIN:  NPRS:  1/10 current, 5/10 worst, 0-1/10 best Location:  anterolateral ankle  PRECAUTIONS: Other: per surgical protocol    WEIGHT BEARING RESTRICTIONS: Yes WBAT Left leg in CAM boot  FALLS:  Has patient fallen in last 6 months? Yes. Number of falls a few falls due to rolling ankle  LIVING ENVIRONMENT: Lives with: lives with their spouse and lives with their daughter Lives in:  1 story home Stairs:  4 steps  to enter home without rail Has following equipment at home: Crutches and CAM walker boot  OCCUPATION:   PLOF: Independent  skateboarding and gardening   PATIENT GOALS: return to PLOF  NEXT MD VISIT: 01/21/2024  OBJECTIVE:  Note: Objective measures were completed at Evaluation unless otherwise noted.  DIAGNOSTIC FINDINGS: Pt is post op.  She had x rays and MRI's prior to surgery.  See Epic for MRI's.   PATIENT SURVEYS:  Give LEFS next visit.   COGNITION: Overall cognitive status: Within functional limits for tasks  assessed     OBSERVATION: Pt in boot and has ACE bandage around ankle.  Pt ambulating without AD.  PT doffed boot and removed ACE bandage.  PT then removed post-op bandage.  Incision appears to be intact with stitches.  Xeroform gauze over incision.  Pt has dried blood on bandage and xeroform gauze though nothing abnormal.  Pt has no signs of infection.  PT applied new gauze and tegaderm over incision.  PT educated pt concerning dressings.     LOWER EXTREMITY ROM:   ROM Right eval Left eval  Hip flexion    Hip extension    Hip abduction    Hip adduction    Hip internal rotation    Hip external rotation    Knee flexion    Knee extension    Ankle dorsiflexion    Ankle plantarflexion    Ankle inversion    Ankle eversion     (Blank rows = not tested)  LOWER EXTREMITY MMT:  Strength not tested due to contraindicated due to healing constraints and surgical protocol   GAIT: Level of assistance: Independent Comments: Pt ambulates with CAM walker boot without any crutches.  Pt reports no increased pain with ambulation without crutch.                                                                                                                                 TREATMENT:    Pt performed quad sets in boot with 5 sec hold with towel under knee and without towel.  PT instructed pt in gentle performance of quad set.  Pt performed toe flex/ext AROM 2x10 reps.  PT instructed pt to perform quad sets in boot and to use a towel or pillow under knee.  Pt received a HEP handout and was educated in correct form and appropriate frequency.  PT instructed pt she should not have pain with HEP.   PATIENT EDUCATION:  Education details: post op and protocol restrictions and limitations, compliance with boot, HEP, relevant anatomy, dx, dressings, ice usage, and Wb'ing restrictions per MD.  PT answered pt's questions.   Person educated: Patient Education method: Explanation, Demonstration, Tactile cues,  Verbal cues, and Handouts Education comprehension: verbalized understanding, returned demonstration, verbal cues required, tactile cues required, and needs further education  HOME EXERCISE PROGRAM: Access Code: [email or web address] URL: [email or web address] Date: 01/11/2024 Prepared by: Marnie Siren  Exercises - Supine  Quadricep Sets  - 2 x daily - 7 x weekly - 2 sets - 10 reps - 5 seconds hold - Seated Toe Curl  - 2-3 x daily - 7 x weekly - 1-2 sets - 10 reps  ASSESSMENT:  CLINICAL IMPRESSION: Patient is a 38 y.o. female 4 days s/p L ankle Brostrom repair with internal brace.  She has expected post op limitations of L ankle pain, limited ankle ROM, muscle weakness in L LE, and difficulty in walking.  Pt has WBAT restrictions in boot.  She presents to Rx without a crutch and reports she has been walking in the boot and mostly without crutches.  Pt is limited with her ADLs/IADLs and functional mobility skills.  She is limited with ambulation and standing activities.  Pt is unable to perform her normal recreational activities/hobbies including skateboarding and gardening.  Pt should benefit from skilled PT per protocol to address impairments and to improve overall function.       OBJECTIVE IMPAIRMENTS: Abnormal gait, decreased activity tolerance, decreased mobility, difficulty walking, decreased ROM, decreased strength, hypomobility, and pain.   ACTIVITY LIMITATIONS: standing, squatting, sleeping, stairs, transfers, and locomotion level  PARTICIPATION LIMITATIONS: meal prep, cleaning, laundry, driving, shopping, community activity, occupation, and yard work  PERSONAL FACTORS:   REHAB POTENTIAL: Good  CLINICAL DECISION MAKING: Stable/uncomplicated  EVALUATION COMPLEXITY: Low   GOALS:   SHORT TERM GOALS:  Pt will be independent and compliant with HEP for improved ROM, strength, mobility, and function.  Baseline: Goal status: INITIAL Target date:  02/15/2024    2.  Pt will  progress and tolerate ankle ROM as appropriate per protocol without adverse effects for improved stiffness, ROM, and mobility.  Baseline:  Goal status: INITIAL Target date:  02/29/2024   3.  Pt will wean out of boot as allowed by MD without adverse effects.  Baseline:  Goal status: INITIAL Target date:  6/16 - 03/07/2024  4.  Pt will ambulate with a normalized heel to toe gait without limping. Baseline:  Goal status: INITIAL Target date:  03/28/2024   5.  Pt will demo at least 10 deg of DF AROM for improved gait and stiffness. Baseline:  Goal status: INITIAL Target date:  03/21/2024    LONG TERM GOALS: Target date: 05/02/2024   Pt will demo R ankle AROM to be [facility] t/o for performance of ADLs and IADLs  Baseline:  Goal status: INITIAL Target date:  04/11/2024    2.   Pt will ambulate extended community distance without increased pain and with good stability.  Baseline:  Goal status: INITIAL  3.  Pt will be able to perform her ADL's/IADL's without significant pain and difficulty.  Baseline:  Goal status: INITIAL  4.  Pt will be to ascend and descend stairs with a reciprocal gait with good control with the rail.  Baseline:  Goal status: INITIAL  5.    Pt will demo 5/5 strength in DF and eversion, WFL in PF in sitting, and 4 to 4+/5 in inversion for improved performance of functional mobility skills and to assist with returning to hobbies and recreational activities. Baseline:  Goal status: INITIAL  6.   Pt will demo 10 symmetrical bilat heel raises without significant pain for improved strength.  Baseline:  Goal status: INITIAL Target date:  04/11/2024    PLAN:  PT FREQUENCY:  1x/4-6 weeks and 2x/wk afterwards  PT DURATION: other: 16 weeks  PLANNED INTERVENTIONS: 97164- PT Re-evaluation, 97750- Physical Performance Testing, 97110-Therapeutic exercises, 97530- Therapeutic  activity, [id number]- Neuromuscular re-education, [phone]- Self Care, 82956- Manual therapy, [phone]- Gait  training, [phone]- Aquatic Therapy, [phone]- Electrical stimulation (unattended), [phone]- Electrical stimulation (manual), 97016- Vasopneumatic device, [id number]- Ultrasound, Patient/Family education, Balance training, Stair training, Taping, Dry Needling, Joint mobilization, Scar mobilization, DME instructions, Cryotherapy, and Moist heat  PLAN FOR NEXT SESSION: Cont per surgical protocol and MD orders.  Give LEFS next visit.  Dressing change next visit.  Assess R ankle ROM.   Trina Fujita III PT, DPT 01/13/24 10:54 AM

## 2024-01-11 ENCOUNTER — Ambulatory Visit (HOSPITAL_BASED_OUTPATIENT_CLINIC_OR_DEPARTMENT_OTHER): Attending: Orthopaedic Surgery | Admitting: Physical Therapy

## 2024-01-11 ENCOUNTER — Other Ambulatory Visit: Payer: Self-pay

## 2024-01-11 ENCOUNTER — Other Ambulatory Visit (HOSPITAL_BASED_OUTPATIENT_CLINIC_OR_DEPARTMENT_OTHER): Payer: Self-pay

## 2024-01-11 ENCOUNTER — Other Ambulatory Visit: Payer: Self-pay | Admitting: Nurse Practitioner

## 2024-01-11 DIAGNOSIS — R262 Difficulty in walking, not elsewhere classified: Secondary | ICD-10-CM | POA: Diagnosis not present

## 2024-01-11 DIAGNOSIS — M25572 Pain in left ankle and joints of left foot: Secondary | ICD-10-CM | POA: Insufficient documentation

## 2024-01-11 DIAGNOSIS — M6281 Muscle weakness (generalized): Secondary | ICD-10-CM | POA: Insufficient documentation

## 2024-01-11 DIAGNOSIS — M25672 Stiffness of left ankle, not elsewhere classified: Secondary | ICD-10-CM | POA: Diagnosis not present

## 2024-01-11 DIAGNOSIS — F900 Attention-deficit hyperactivity disorder, predominantly inattentive type: Secondary | ICD-10-CM

## 2024-01-11 DIAGNOSIS — M25372 Other instability, left ankle: Secondary | ICD-10-CM | POA: Diagnosis not present

## 2024-01-11 MED ORDER — LISDEXAMFETAMINE DIMESYLATE 70 MG PO CAPS
70.0000 mg | ORAL_CAPSULE | Freq: Every day | ORAL | 0 refills | Status: DC
  Filled 2024-01-11: qty 30, 30d supply, fill #0

## 2024-01-11 MED ORDER — AMPHETAMINE-DEXTROAMPHETAMINE 20 MG PO TABS
20.0000 mg | ORAL_TABLET | Freq: Two times a day (BID) | ORAL | 0 refills | Status: DC
  Filled 2024-01-11: qty 60, 30d supply, fill #0

## 2024-01-11 NOTE — Telephone Encounter (Signed)
 Already sent in I can not deny this refill sending to you for denial.

## 2024-01-12 ENCOUNTER — Encounter (HOSPITAL_BASED_OUTPATIENT_CLINIC_OR_DEPARTMENT_OTHER): Payer: Self-pay | Admitting: Physical Therapy

## 2024-01-13 ENCOUNTER — Encounter (HOSPITAL_BASED_OUTPATIENT_CLINIC_OR_DEPARTMENT_OTHER): Payer: Self-pay | Admitting: Orthopaedic Surgery

## 2024-01-14 ENCOUNTER — Ambulatory Visit
Admission: RE | Admit: 2024-01-14 | Discharge: 2024-01-14 | Disposition: A | Discharge status: Home / Self Care | Source: Ambulatory Visit | Attending: Nurse Practitioner | Admitting: Nurse Practitioner

## 2024-01-14 ENCOUNTER — Encounter (HOSPITAL_BASED_OUTPATIENT_CLINIC_OR_DEPARTMENT_OTHER): Payer: Self-pay | Admitting: Physical Therapy

## 2024-01-14 DIAGNOSIS — N6325 Unspecified lump in the left breast, overlapping quadrants: Secondary | ICD-10-CM | POA: Diagnosis not present

## 2024-01-14 DIAGNOSIS — N63 Unspecified lump in unspecified breast: Secondary | ICD-10-CM

## 2024-01-14 DIAGNOSIS — N6002 Solitary cyst of left breast: Secondary | ICD-10-CM | POA: Diagnosis not present

## 2024-01-15 ENCOUNTER — Ambulatory Visit (HOSPITAL_BASED_OUTPATIENT_CLINIC_OR_DEPARTMENT_OTHER): Admitting: Physical Therapy

## 2024-01-17 NOTE — Therapy (Signed)
 OUTPATIENT PHYSICAL THERAPY LOWER EXTREMITY TREATMENT   Patient Name: Jessica Graham MRN: [id number] DOB:09/25/1985, 38 y.o., female Today's Date: 01/19/2024  END OF SESSION:  PT End of Session - 01/19/24 1309     Visit Number 2    Number of Visits 26    Date for PT Re-Evaluation 04/04/24    Authorization Type [employer]    PT Start Time 1024    PT Stop Time 1109    PT Time Calculation (min) 45 min    Activity Tolerance Patient tolerated treatment well    Behavior During Therapy [facility] for tasks assessed/performed              Past Medical History:  Diagnosis Date   ADD (attention deficit disorder)    Anxiety    Depression    Fatigue 10/30/2021   Gestational hypertension    Poor motivation 01/30/2023   Recent skin changes 12/15/2022   Tachycardia, unspecified 02/23/2023   Past Surgical History:  Procedure Laterality Date   LAPAROSCOPIC APPENDECTOMY N/A 10/19/2020   Procedure: APPENDECTOMY LAPAROSCOPIC;  Surgeon: Shela Derby, MD;  Location: [facility] OR;  Service: General;  Laterality: N/A;   TONSILLECTOMY  2008   Patient Active Problem List   Diagnosis Date Noted   Injury of left ankle 12/14/2023   Encounter for annual physical exam 12/11/2023   Breast nodule 12/11/2023   Asthmatic bronchitis 03/13/2023   Depression, recurrent (HCC) 02/23/2023   Leukocytosis 02/23/2023   Tremors of nervous system 12/15/2022   Pain of left thumb 12/15/2022   Breakthrough bleeding on birth control pills 10/30/2021   Attention deficit hyperactivity disorder, predominantly inattentive type 03/07/2015     REFERRING PROVIDER: Wilhelmenia Harada, MD  REFERRING DIAG: [phone] (ICD-10-CM) - Other instability, left ankle  THERAPY DIAG:  Pain in left ankle and joints of left foot  Stiffness of left ankle, not elsewhere classified  Muscle weakness (generalized)  Difficulty in walking, not elsewhere classified  Rationale for Evaluation and Treatment: Rehabilitation  ONSET DATE: DOS  01/07/2024  SUBJECTIVE:   SUBJECTIVE STATEMENT: Pt is 1 week and 4 days s/p Left ankle Brostrom repair with internal brace.  Pt denies any adverse effects after prior Rx.  Pt reports compliance with HEP.   Pt states she did trip over her daughter's balance bike and caught herself with the L foot on Saturday.  She was in the boot.  Pt reports having pain when it happened, but went away the following day.  Pt states she was careful but did some gardening.  She denies any pain afterwards.     PERTINENT HISTORY: Left ankle Brostrom repair with internal brace on 01/07/24 possible L knee LCL type sprain from 09/18/23 injury per MD note  ADD, mild depression, tachycardia which may be due to medication  PAIN:  NPRS:  .5/10 current, 5/10 worst, 0-1/10 best Location:  anterolateral ankle.  It feels a little irriated around the incision.  It feels more exterior than interior.   PRECAUTIONS: Other: per surgical protocol    WEIGHT BEARING RESTRICTIONS: Yes WBAT Left leg in CAM boot  FALLS:  Has patient fallen in last 6 months? Yes. Number of falls a few falls due to rolling ankle  LIVING ENVIRONMENT: Lives with: lives with their spouse and lives with their daughter Lives in:  1 story home Stairs:  4 steps to enter home without rail Has following equipment at home: Crutches and CAM walker boot  OCCUPATION:   PLOF: Independent  skateboarding and gardening   PATIENT  GOALS: return to PLOF  NEXT MD VISIT: 01/21/2024  OBJECTIVE:  Note: Objective measures were completed at Evaluation unless otherwise noted.  DIAGNOSTIC FINDINGS: Pt is post op.  She had x rays and MRI's prior to surgery.  See Epic for MRI's.   PATIENT SURVEYS:  Give LEFS next visit.   COGNITION: Overall cognitive status: Within functional limits for tasks assessed          GAIT: Level of assistance: Independent Comments:  Pt reports no increased pain with ambulation without crutch.                                                                                                                                  TREATMENT:     quad sets with 5 sec hold in boot 2x10 with towel under knee  supine SLR in boot 2x10 reps   -She had no hip, knee, ankle or foot pain with supine SLR with boot.     Toe flexion and extension AROM 3x10 Toe spreads 3x10  PT removed dressings and applied new gauze and tegaderm over incision.  Incision appears to be intact with stitches.  Xeroform gauze over incision.  Pt has no signs of infection.   PT educated pt concerning dressings.  Pt received a HEP handout and was educated in correct form and appropriate frequency.  Pt was instructed to perform supine SLR in boot.  PT instructed pt she should not have pain in L LE with exercise.  PATIENT EDUCATION:  Education details: post op and protocol restrictions and limitations, exercise form, compliance with boot, HEP, relevant anatomy, dx, dressings, and Wb'ing restrictions per MD.  PT answered pt's questions.   Person educated: Patient Education method: Explanation, Demonstration, Tactile cues, Verbal cues, and Handouts Education comprehension: verbalized understanding, returned demonstration, verbal cues required, tactile cues required, and needs further education  HOME EXERCISE PROGRAM: Access Code: [email or web address] URL: [email or web address] Date: 01/11/2024 Prepared by: Marnie Siren  Exercises - Supine Quadricep Sets  - 2 x daily - 7 x weekly - 2 sets - 10 reps - 5 seconds hold - Seated Toe Curl  - 2-3 x daily - 7 x weekly - 1-2 sets - 10 reps  Updated HEP: - Small Range Straight Leg Raise - 1 x daily - 5 x weekly - 2 sets - 10 reps (In Boot)   ASSESSMENT:  CLINICAL IMPRESSION:  Pt ambulates with CAM walker boot without any crutches without adverse effects.  Pt is compliant with wearing boot.  PT changed dressings today and incision looks good.  Pt performed exercises per protocol with cuing and instruction for  correct form and she tolerated exercises well.  She performed supine SLR in boot well without any c/o's.  Pt reports no hip, knee, ankle or foot pain with supine SLR with boot.  Pt responded well to Rx having no pain and no c/o's after Rx.  Pt should benefit from skilled PT  per protocol to address impairments and to improve overall function.      OBJECTIVE IMPAIRMENTS: Abnormal gait, decreased activity tolerance, decreased mobility, difficulty walking, decreased ROM, decreased strength, hypomobility, and pain.   ACTIVITY LIMITATIONS: standing, squatting, sleeping, stairs, transfers, and locomotion level  PARTICIPATION LIMITATIONS: meal prep, cleaning, laundry, driving, shopping, community activity, occupation, and yard work  PERSONAL FACTORS:   REHAB POTENTIAL: Good  CLINICAL DECISION MAKING: Stable/uncomplicated  EVALUATION COMPLEXITY: Low   GOALS:   SHORT TERM GOALS:  Pt will be independent and compliant with HEP for improved ROM, strength, mobility, and function.  Baseline: Goal status: INITIAL Target date:  02/15/2024    2.  Pt will progress and tolerate ankle ROM as appropriate per protocol without adverse effects for improved stiffness, ROM, and mobility.  Baseline:  Goal status: INITIAL Target date:  02/29/2024   3.  Pt will wean out of boot as allowed by MD without adverse effects.  Baseline:  Goal status: INITIAL Target date:  6/16 - 03/07/2024  4.  Pt will ambulate with a normalized heel to toe gait without limping. Baseline:  Goal status: INITIAL Target date:  03/28/2024   5.  Pt will demo at least 10 deg of DF AROM for improved gait and stiffness. Baseline:  Goal status: INITIAL Target date:  03/21/2024    LONG TERM GOALS: Target date: 05/02/2024   Pt will demo R ankle AROM to be [facility] t/o for performance of ADLs and IADLs  Baseline:  Goal status: INITIAL Target date:  04/11/2024    2.   Pt will ambulate extended community distance without increased pain  and with good stability.  Baseline:  Goal status: INITIAL  3.  Pt will be able to perform her ADL's/IADL's without significant pain and difficulty.  Baseline:  Goal status: INITIAL  4.  Pt will be to ascend and descend stairs with a reciprocal gait with good control with the rail.  Baseline:  Goal status: INITIAL  5.    Pt will demo 5/5 strength in DF and eversion, WFL in PF in sitting, and 4 to 4+/5 in inversion for improved performance of functional mobility skills and to assist with returning to hobbies and recreational activities. Baseline:  Goal status: INITIAL  6.   Pt will demo 10 symmetrical bilat heel raises without significant pain for improved strength.  Baseline:  Goal status: INITIAL Target date:  04/11/2024    PLAN:  PT FREQUENCY:  1x/4-6 weeks and 2x/wk afterwards  PT DURATION: other: 16 weeks  PLANNED INTERVENTIONS: 97164- PT Re-evaluation, 97750- Physical Performance Testing, 97110-Therapeutic exercises, 97530- Therapeutic activity, [id number]- Neuromuscular re-education, 97535- Self Care, 16109- Manual therapy, [phone]- Gait training, [phone]- Aquatic Therapy, [phone]- Electrical stimulation (unattended), [phone]- Electrical stimulation (manual), 97016- Vasopneumatic device, 97035- Ultrasound, Patient/Family education, Balance training, Stair training, Taping, Dry Needling, Joint mobilization, Scar mobilization, DME instructions, Cryotherapy, and Moist heat  PLAN FOR NEXT SESSION: Cont per surgical protocol and MD orders.  Give LEFS next visit.   Trina Fujita III PT, DPT 01/19/24 5:21 PM

## 2024-01-18 ENCOUNTER — Ambulatory Visit (HOSPITAL_BASED_OUTPATIENT_CLINIC_OR_DEPARTMENT_OTHER): Attending: Orthopaedic Surgery | Admitting: Physical Therapy

## 2024-01-18 DIAGNOSIS — R262 Difficulty in walking, not elsewhere classified: Secondary | ICD-10-CM | POA: Insufficient documentation

## 2024-01-18 DIAGNOSIS — M25572 Pain in left ankle and joints of left foot: Secondary | ICD-10-CM | POA: Insufficient documentation

## 2024-01-18 DIAGNOSIS — M25672 Stiffness of left ankle, not elsewhere classified: Secondary | ICD-10-CM | POA: Insufficient documentation

## 2024-01-18 DIAGNOSIS — M6281 Muscle weakness (generalized): Secondary | ICD-10-CM | POA: Diagnosis not present

## 2024-01-19 ENCOUNTER — Encounter (HOSPITAL_BASED_OUTPATIENT_CLINIC_OR_DEPARTMENT_OTHER): Payer: Self-pay | Admitting: Physical Therapy

## 2024-01-21 ENCOUNTER — Ambulatory Visit (INDEPENDENT_AMBULATORY_CARE_PROVIDER_SITE_OTHER): Admitting: Orthopaedic Surgery

## 2024-01-21 DIAGNOSIS — M25372 Other instability, left ankle: Secondary | ICD-10-CM

## 2024-01-21 NOTE — Progress Notes (Signed)
 Post Operative Evaluation    Procedure/Date of Surgery: Left ankle Brostrm repair with internal brace 4/24  Interval History:    Presents 2 weeks status post the above procedure.  Overall doing extremely well.  Denies any pain.  She is walking without any pain.  She is overall very happy with her progress she is working in physical therapy   PMH/PSH/Family History/Social History/Meds/Allergies:    Past Medical History:  Diagnosis Date   ADD (attention deficit disorder)    Anxiety    Depression    Fatigue 10/30/2021   Gestational hypertension    Poor motivation 01/30/2023   Recent skin changes 12/15/2022   Tachycardia, unspecified 02/23/2023   Past Surgical History:  Procedure Laterality Date   LAPAROSCOPIC APPENDECTOMY N/A 10/19/2020   Procedure: APPENDECTOMY LAPAROSCOPIC;  Surgeon: Shela Derby, MD;  Location: [facility] OR;  Service: General;  Laterality: N/A;   TONSILLECTOMY  2008   Social History   Socioeconomic History   Marital status: Married    Spouse name: Not on file   Number of children: Not on file   Years of education: Not on file   Highest education level: Not on file  Occupational History   Not on file  Tobacco Use   Smoking status: Never   Smokeless tobacco: Never  Substance and Sexual Activity   Alcohol use: Yes   Drug use: Never   Sexual activity: Yes    Birth control/protection: Pill  Other Topics Concern   Not on file  Social History Narrative   Not on file   Social Drivers of Health   Financial Resource Strain: Low Risk  (12/01/2019)   Received from [facility], [facility]   Overall Financial Resource Strain (CARDIA)    Difficulty of Paying Living Expenses: Not hard at all  Food Insecurity: No Food Insecurity (12/01/2019)   Received from [facility], [facility]   Hunger Vital Sign    Worried About Running Out of Food in the Last Year: Never true    Ran Out of Food in the Last Year:  Never true  Transportation Needs: No Transportation Needs (12/01/2019)   Received from [facility], [facility]   [facility] - Transportation    Lack of Transportation (Medical): No    Lack of Transportation (Non-Medical): No  Physical Activity: Insufficiently Active (12/01/2019)   Received from [facility], [facility]   Exercise Vital Sign    Days of Exercise per Week: 4 days    Minutes of Exercise per Session: 20 min  Stress: No Stress Concern Present (12/01/2019)   Received from [facility], [facility] of [facility] - Occupational Stress Questionnaire    Feeling of Stress : Not at all  Social Connections: Unknown (12/01/2019)   Received from [facility], [facility]   Social Connection and Isolation Panel [NHANES]    Frequency of Communication with Friends and Family: Once a week    Frequency of Social Gatherings with Friends and Family: Once a week    Attends Religious Services: Never    Database administrator or Organizations: No    Attends Banker Meetings: Never    Marital Status: Not on file   No family history on file.  No Known Allergies Current Outpatient Medications  Medication Sig Dispense Refill   amphetamine -dextroamphetamine  (ADDERALL) 20 MG tablet Take 1 tablet (20 mg total) by mouth 2 (two) times daily. 60 tablet 0   [START ON 02/05/2024] amphetamine -dextroamphetamine  (ADDERALL) 20 MG tablet Take 1 tablet (20 mg total) by mouth 2 (two) times daily. 60 tablet 0   amphetamine -dextroamphetamine  (ADDERALL) 20 MG tablet Take 1 tablet (20 mg total) by mouth 2 (two) times daily. 60 tablet 0   aspirin  EC 325 MG tablet Take 1 tablet (325 mg total) by mouth daily. (Patient not taking: Reported on 12/11/2023) 14 tablet 0   budesonide -formoterol  (SYMBICORT ) 80-4.5 MCG/ACT inhaler Inhale 2 puffs into the lungs 2 (two) times daily. 1 each 5   buPROPion  (WELLBUTRIN  XL) 150 MG 24 hr tablet Take 2 tablets (300 mg  total) by mouth daily. 180 tablet 3   cholecalciferol (VITAMIN D3) 25 MCG (1000 UNIT) tablet Take 1,000 Units by mouth daily. Pt. Takes 2,000 units dialy     [START ON 02/05/2024] lisdexamfetamine (VYVANSE ) 70 MG capsule Take 1 capsule (70 mg total) by mouth daily. 30 capsule 0   lisdexamfetamine (VYVANSE ) 70 MG capsule Take 1 capsule (70 mg total) by mouth daily. 30 capsule 0   lisdexamfetamine (VYVANSE ) 70 MG capsule Take 1 capsule (70 mg total) by mouth daily. 30 capsule 0   norethindrone  (MICRONOR ) 0.35 MG tablet Take 1 tablet (0.35 mg total) by mouth every morning. 84 tablet 3   omeprazole (PRILOSEC) 20 MG capsule Take 20 mg by mouth daily.     ondansetron  (ZOFRAN ) 8 MG tablet Take 1 tablet (8 mg total) by mouth every 8 (eight) hours as needed for nausea or vomiting. 30 tablet 5   oxyCODONE  (ROXICODONE ) 5 MG immediate release tablet Take 1 tablet (5 mg total) by mouth every 4 (four) hours as needed for severe pain (pain score 7-10) or breakthrough pain. (Patient not taking: Reported on 12/11/2023) 10 tablet 0   No current facility-administered medications for this visit.   No results found.  Review of Systems:   A ROS was performed including pertinent positives and negatives as documented in the HPI.   Musculoskeletal Exam:    There were no vitals taken for this visit.  Left ankle incisions well-appearing without erythema or drainage.  Negative anterior drawer.  Distal neurosensory exam is intact.  Mild to no swelling  Imaging:      I personally reviewed and interpreted the radiographs.   Assessment:   2 weeks status post left ankle Brostrm repair overall doing extremely well.  At this time I will plan to see her back in 4 weeks for reassessment.  She may wean out of her boot at this time into a normal shoe  Plan :    - Return to clinic 4 weeks for reassessment      I personally saw and evaluated the patient, and participated in the management and treatment  plan.  Jessica Harada, MD Attending Physician, Orthopedic Surgery  This document was dictated using Dragon voice recognition software. A reasonable attempt at proof reading has been made to minimize errors.

## 2024-01-24 ENCOUNTER — Encounter (HOSPITAL_BASED_OUTPATIENT_CLINIC_OR_DEPARTMENT_OTHER): Payer: Self-pay | Admitting: Orthopaedic Surgery

## 2024-01-24 NOTE — Therapy (Signed)
 OUTPATIENT PHYSICAL THERAPY LOWER EXTREMITY TREATMENT   Patient Name: Jessica Graham MRN: [id number] DOB:[birth year], 38 y.o., female Today's Date: 01/26/2024  END OF SESSION:  PT End of Session - 01/25/24 1036     Visit Number 3    Number of Visits 26    Date for PT Re-Evaluation 04/04/24    Authorization Type BCBS    PT Start Time 1033    PT Stop Time 1103    PT Time Calculation (min) 30 min    Activity Tolerance Patient tolerated treatment well    Behavior During Therapy [facility] for tasks assessed/performed               Past Medical History:  Diagnosis Date   ADD (attention deficit disorder)    Anxiety    Depression    Fatigue 10/30/2021   Gestational hypertension    Poor motivation 01/30/2023   Recent skin changes 12/15/2022   Tachycardia, unspecified 02/23/2023   Past Surgical History:  Procedure Laterality Date   LAPAROSCOPIC APPENDECTOMY N/A 10/19/2020   Procedure: APPENDECTOMY LAPAROSCOPIC;  Surgeon: Shela Derby, MD;  Location: [facility] OR;  Service: General;  Laterality: N/A;   TONSILLECTOMY  2008   Patient Active Problem List   Diagnosis Date Noted   Injury of left ankle 12/14/2023   Encounter for annual physical exam 12/11/2023   Breast nodule 12/11/2023   Asthmatic bronchitis 03/13/2023   Depression, recurrent (HCC) 02/23/2023   Leukocytosis 02/23/2023   Tremors of nervous system 12/15/2022   Pain of left thumb 12/15/2022   Breakthrough bleeding on birth control pills 10/30/2021   Attention deficit hyperactivity disorder, predominantly inattentive type 03/07/2015     REFERRING PROVIDER: Wilhelmenia Harada, MD  REFERRING DIAG: [phone] (ICD-10-CM) - Other instability, left ankle  THERAPY DIAG:  Pain in left ankle and joints of left foot  Stiffness of left ankle, not elsewhere classified  Muscle weakness (generalized)  Difficulty in walking, not elsewhere classified  Rationale for Evaluation and Treatment: Rehabilitation  ONSET DATE: DOS  01/07/2024  SUBJECTIVE:   SUBJECTIVE STATEMENT: Pt is 2 weeks and 4 days s/p Left ankle Brostrom repair with internal brace.  Pt denies any adverse effects after prior Rx.  Pt reports compliance with HEP.   Pt saw MD last week and had her stitches removed.  Pt states MD removed her boot and note indicated pt to wean out of her boot at this time into a normal shoe.  Pt has weaned out of her boot without any adverse effects.      PERTINENT HISTORY: Left ankle Brostrom repair with internal brace on 01/07/24 possible L knee LCL type sprain from 09/18/23 injury per MD note  ADD, mild depression, tachycardia which may be due to medication  PAIN:  NPRS:  0/10 current, 5/10 worst, 0-1/10 best Location:  anterolateral ankle.  It feels a little irriated around the incision.  It feels more exterior than interior.   PRECAUTIONS: Other: per surgical protocol    WEIGHT BEARING RESTRICTIONS: Yes WBAT Left leg in CAM boot  FALLS:  Has patient fallen in last 6 months? Yes. Number of falls a few falls due to rolling ankle  LIVING ENVIRONMENT: Lives with: lives with their spouse and lives with their daughter Lives in:  1 story home Stairs:  4 steps to enter home without rail Has following equipment at home: Crutches and CAM walker boot  OCCUPATION:   PLOF: Independent  skateboarding and gardening   PATIENT GOALS: return to [employer]  NEXT MD  VISIT: 01/21/2024  OBJECTIVE:  Note: Objective measures were completed at Evaluation unless otherwise noted.  DIAGNOSTIC FINDINGS: Pt is post op.  She had x rays and MRI's prior to surgery.  See Epic for MRI's.   PATIENT SURVEYS:  Give LEFS next visit.   COGNITION: Overall cognitive status: Within functional limits for tasks assessed          GAIT: Level of assistance: Independent Comments:  Pt reports no increased pain with ambulation without crutch.                                                                                                                                  TREATMENT:    LEFS:  49/80   quad sets with 5 sec hold in boot x10 with towel under knee  supine SLR 3x10 reps      Toe flexion and extension AROM 3x10 Toe spreads 3x10             Ankle active DF ROM 2x10   Pt received gentle DF PROM w/n pt and tissue tolerance.  Incision has no stitches and looks good.  Incision is intact and pt has no signs of infection.  Steri strips have all fallen off.     PATIENT EDUCATION:  Education details: post op and protocol restrictions and limitations, exercise form, compliance with boot, HEP, relevant anatomy, dx, dressings, and Wb'ing restrictions per MD.  PT answered pt's questions.   Person educated: Patient Education method: Explanation, Demonstration, Tactile cues, Verbal cues, and Handouts Education comprehension: verbalized understanding, returned demonstration, verbal cues required, tactile cues required, and needs further education  HOME EXERCISE PROGRAM: Access Code: [email or web address] URL: [email or web address] Date: 01/11/2024 Prepared by: Marnie Siren   ASSESSMENT:  CLINICAL IMPRESSION: Pt presents to Rx without boot.  MD has removed boot and she weaned out of boot without adverse effects.  Pt reports she is doing well and has no pain.  Pt performed exercises per protocol well without any c/o's.  Pt able to perform ankle DF AROM well and reports no pain with ankle DF ROM.  PT gently performed DF PROM and pt had no pain and no c/o's.  Pt filled out LEFS today.  She responded well to Rx having no pain after Rx.  Pt should benefit from continued skilled PT per protocol to address impairments and to improve overall function.          OBJECTIVE IMPAIRMENTS: Abnormal gait, decreased activity tolerance, decreased mobility, difficulty walking, decreased ROM, decreased strength, hypomobility, and pain.   ACTIVITY LIMITATIONS: standing, squatting, sleeping, stairs, transfers, and locomotion  level  PARTICIPATION LIMITATIONS: meal prep, cleaning, laundry, driving, shopping, community activity, occupation, and yard work  PERSONAL FACTORS:   REHAB POTENTIAL: Good  CLINICAL DECISION MAKING: Stable/uncomplicated  EVALUATION COMPLEXITY: Low   GOALS:   SHORT TERM GOALS:  Pt will be independent and compliant with HEP for improved ROM, strength, mobility, and function.  Baseline: Goal status: INITIAL Target date:  02/15/2024    2.  Pt will progress and tolerate ankle ROM as appropriate per protocol without adverse effects for improved stiffness, ROM, and mobility.  Baseline:  Goal status: INITIAL Target date:  02/29/2024   3.  Pt will wean out of boot as allowed by MD without adverse effects.  Baseline:  Goal status: INITIAL Target date:  6/16 - 03/07/2024  4.  Pt will ambulate with a normalized heel to toe gait without limping. Baseline:  Goal status: INITIAL Target date:  03/28/2024   5.  Pt will demo at least 10 deg of DF AROM for improved gait and stiffness. Baseline:  Goal status: INITIAL Target date:  03/21/2024    LONG TERM GOALS: Target date: 05/02/2024   Pt will demo R ankle AROM to be [facility] t/o for performance of ADLs and IADLs  Baseline:  Goal status: INITIAL Target date:  04/11/2024    2.   Pt will ambulate extended community distance without increased pain and with good stability.  Baseline:  Goal status: INITIAL  3.  Pt will be able to perform her ADL's/IADL's without significant pain and difficulty.  Baseline:  Goal status: INITIAL  4.  Pt will be to ascend and descend stairs with a reciprocal gait with good control with the rail.  Baseline:  Goal status: INITIAL  5.    Pt will demo 5/5 strength in DF and eversion, WFL in PF in sitting, and 4 to 4+/5 in inversion for improved performance of functional mobility skills and to assist with returning to hobbies and recreational activities. Baseline:  Goal status: INITIAL  6.   Pt will demo 10  symmetrical bilat heel raises without significant pain for improved strength.  Baseline:  Goal status: INITIAL Target date:  04/11/2024    PLAN:  PT FREQUENCY: 1x/4-6 weeks and 2x/wk afterwards  PT DURATION: other: 16 weeks  PLANNED INTERVENTIONS: 97164- PT Re-evaluation, 97750- Physical Performance Testing, 97110-Therapeutic exercises, 97530- Therapeutic activity, [id number]- Neuromuscular re-education, 97535- Self Care, 28413- Manual therapy, [phone]- Gait training, [phone]- Aquatic Therapy, [phone]- Electrical stimulation (unattended), [phone]- Electrical stimulation (manual), 97016- Vasopneumatic device, 97035- Ultrasound, Patient/Family education, Balance training, Stair training, Taping, Dry Needling, Joint mobilization, Scar mobilization, DME instructions, Cryotherapy, and Moist heat  PLAN FOR NEXT SESSION: Cont per surgical protocol and MD orders.     Trina Fujita III PT, DPT 01/26/24 3:13 PM

## 2024-01-25 ENCOUNTER — Ambulatory Visit (HOSPITAL_BASED_OUTPATIENT_CLINIC_OR_DEPARTMENT_OTHER): Admitting: Physical Therapy

## 2024-01-25 ENCOUNTER — Encounter (HOSPITAL_BASED_OUTPATIENT_CLINIC_OR_DEPARTMENT_OTHER): Payer: Self-pay | Admitting: Physical Therapy

## 2024-01-25 DIAGNOSIS — R262 Difficulty in walking, not elsewhere classified: Secondary | ICD-10-CM

## 2024-01-25 DIAGNOSIS — M25672 Stiffness of left ankle, not elsewhere classified: Secondary | ICD-10-CM

## 2024-01-25 DIAGNOSIS — M6281 Muscle weakness (generalized): Secondary | ICD-10-CM

## 2024-01-25 DIAGNOSIS — M25572 Pain in left ankle and joints of left foot: Secondary | ICD-10-CM | POA: Diagnosis not present

## 2024-02-01 ENCOUNTER — Ambulatory Visit (HOSPITAL_BASED_OUTPATIENT_CLINIC_OR_DEPARTMENT_OTHER)

## 2024-02-01 ENCOUNTER — Encounter (HOSPITAL_BASED_OUTPATIENT_CLINIC_OR_DEPARTMENT_OTHER): Payer: Self-pay

## 2024-02-01 DIAGNOSIS — M25572 Pain in left ankle and joints of left foot: Secondary | ICD-10-CM

## 2024-02-01 DIAGNOSIS — M25672 Stiffness of left ankle, not elsewhere classified: Secondary | ICD-10-CM | POA: Diagnosis not present

## 2024-02-01 DIAGNOSIS — M6281 Muscle weakness (generalized): Secondary | ICD-10-CM | POA: Diagnosis not present

## 2024-02-01 DIAGNOSIS — R262 Difficulty in walking, not elsewhere classified: Secondary | ICD-10-CM | POA: Diagnosis not present

## 2024-02-01 NOTE — Therapy (Signed)
 OUTPATIENT PHYSICAL THERAPY LOWER EXTREMITY TREATMENT   Patient Name: Jessica Graham MRN: [id number] DOB:[birth year], 38 y.o., female Today's Date: 02/01/2024  END OF SESSION:  PT End of Session - 02/01/24 1019     Visit Number 4    Number of Visits 26    Date for PT Re-Evaluation 04/04/24    Authorization Type BCBS    PT Start Time 1016    PT Stop Time 1100    PT Time Calculation (min) 44 min    Activity Tolerance Patient tolerated treatment well    Behavior During Therapy [employer] for tasks assessed/performed                Past Medical History:  Diagnosis Date   ADD (attention deficit disorder)    Anxiety    Depression    Fatigue 10/30/2021   Gestational hypertension    Poor motivation 01/30/2023   Recent skin changes 12/15/2022   Tachycardia, unspecified 02/23/2023   Past Surgical History:  Procedure Laterality Date   LAPAROSCOPIC APPENDECTOMY N/A 10/19/2020   Procedure: APPENDECTOMY LAPAROSCOPIC;  Surgeon: Shela Derby, MD;  Location: [facility] OR;  Service: General;  Laterality: N/A;   TONSILLECTOMY  2008   Patient Active Problem List   Diagnosis Date Noted   Injury of left ankle 12/14/2023   Encounter for annual physical exam 12/11/2023   Breast nodule 12/11/2023   Asthmatic bronchitis 03/13/2023   Depression, recurrent (HCC) 02/23/2023   Leukocytosis 02/23/2023   Tremors of nervous system 12/15/2022   Pain of left thumb 12/15/2022   Breakthrough bleeding on birth control pills 10/30/2021   Attention deficit hyperactivity disorder, predominantly inattentive type 03/07/2015     REFERRING PROVIDER: Wilhelmenia Harada, MD  REFERRING DIAG: [id number] (ICD-10-CM) - Other instability, left ankle  THERAPY DIAG:  Pain in left ankle and joints of left foot  Stiffness of left ankle, not elsewhere classified  Muscle weakness (generalized)  Difficulty in walking, not elsewhere classified  Rationale for Evaluation and Treatment: Rehabilitation  ONSET DATE: DOS  01/07/2024  SUBJECTIVE:   SUBJECTIVE STATEMENT:  Pt arrives without boot and sandals donned. No c/o of pain at rest. Does have some pain with pivoting movements.     PERTINENT HISTORY: Left ankle Brostrom repair with internal brace on 01/07/24 possible L knee LCL type sprain from 09/18/23 injury per MD note  ADD, mild depression, tachycardia which may be due to medication  PAIN:  NPRS:  0/10 current, 5/10 worst, 0-1/10 best Location:  anterolateral ankle.  It feels a little irriated around the incision.  It feels more exterior than interior.   PRECAUTIONS: Other: per surgical protocol    WEIGHT BEARING RESTRICTIONS: Yes WBAT Left leg in CAM boot  FALLS:  Has patient fallen in last 6 months? Yes. Number of falls a few falls due to rolling ankle  LIVING ENVIRONMENT: Lives with: lives with their spouse and lives with their daughter Lives in:  1 story home Stairs:  4 steps to enter home without rail Has following equipment at home: Crutches and CAM walker boot  OCCUPATION:   PLOF: Independent  skateboarding and gardening   PATIENT GOALS: return to [facility]  NEXT MD VISIT: 01/21/2024  OBJECTIVE:  Note: Objective measures were completed at Evaluation unless otherwise noted.  DIAGNOSTIC FINDINGS: Pt is post op.  She had x rays and MRI's prior to surgery.  See Epic for MRI's.   PATIENT SURVEYS:  LEFS:  49/80  COGNITION: Overall cognitive status: Within functional limits for tasks assessed  GAIT: Level of assistance: Independent Comments:  Pt reports no increased pain with ambulation without crutch.                                                                                                                                 TREATMENT:     5/19: PROM L ankle DF, PF, eversion Isometric eversion, DF 5" x10ea Bridge with LES on physioball 3" 3x10 Sidelying hip abduction 2x15ea Sit to stands x30 Seated toe scrunches x30 Seated HR/TR x20ea Seated toe yoga  x30ea Standing calf stretch 3x30sec HEP update/review    Previous: LEFS:  49/80   quad sets with 5 sec hold in boot x10 with towel under knee  supine SLR 3x10 reps      Toe flexion and extension AROM 3x10 Toe spreads 3x10             Ankle active DF ROM 2x10   Pt received gentle DF PROM w/n pt and tissue tolerance.  Incision has no stitches and looks good.  Incision is intact and pt has no signs of infection.  Steri strips have all fallen off.     PATIENT EDUCATION:  Education details: post op and protocol restrictions and limitations, exercise form, compliance with boot, HEP, relevant anatomy, dx, dressings, and Wb'ing restrictions per MD.  PT answered pt's questions.   Person educated: Patient Education method: Explanation, Demonstration, Tactile cues, Verbal cues, and Handouts Education comprehension: verbalized understanding, returned demonstration, verbal cues required, tactile cues required, and needs further education  HOME EXERCISE PROGRAM: Access Code: [email or web address] URL: [email or web address] Date: 01/11/2024 Prepared by: Marnie Siren   ASSESSMENT:  CLINICAL IMPRESSION: Pt able to complete gentle open chain exercises without complaint. No discomfort reported with passive ankle ROM within protocol range. Initiated ankle isometrics for eversion and DF strengthening without complaint. Updated HEP to reflect progressions with instructions not to push past pain limitations. Reviewed restrictions and precautions at this.   OBJECTIVE IMPAIRMENTS: Abnormal gait, decreased activity tolerance, decreased mobility, difficulty walking, decreased ROM, decreased strength, hypomobility, and pain.   ACTIVITY LIMITATIONS: standing, squatting, sleeping, stairs, transfers, and locomotion level  PARTICIPATION LIMITATIONS: meal prep, cleaning, laundry, driving, shopping, community activity, occupation, and yard work  PERSONAL FACTORS:   REHAB POTENTIAL: Good  CLINICAL  DECISION MAKING: Stable/uncomplicated  EVALUATION COMPLEXITY: Low   GOALS:   SHORT TERM GOALS:  Pt will be independent and compliant with HEP for improved ROM, strength, mobility, and function.  Baseline: Goal status: INITIAL Target date:  02/15/2024    2.  Pt will progress and tolerate ankle ROM as appropriate per protocol without adverse effects for improved stiffness, ROM, and mobility.  Baseline:  Goal status: INITIAL Target date:  02/29/2024   3.  Pt will wean out of boot as allowed by MD without adverse effects.  Baseline:  Goal status: INITIAL Target date:  6/16 - 03/07/2024  4.  Pt will ambulate with a normalized heel to  toe gait without limping. Baseline:  Goal status: INITIAL Target date:  03/28/2024   5.  Pt will demo at least 10 deg of DF AROM for improved gait and stiffness. Baseline:  Goal status: INITIAL Target date:  03/21/2024    LONG TERM GOALS: Target date: 05/02/2024   Pt will demo R ankle AROM to be [facility] t/o for performance of ADLs and IADLs  Baseline:  Goal status: INITIAL Target date:  04/11/2024    2.   Pt will ambulate extended community distance without increased pain and with good stability.  Baseline:  Goal status: INITIAL  3.  Pt will be able to perform her ADL's/IADL's without significant pain and difficulty.  Baseline:  Goal status: INITIAL  4.  Pt will be to ascend and descend stairs with a reciprocal gait with good control with the rail.  Baseline:  Goal status: INITIAL  5.    Pt will demo 5/5 strength in DF and eversion, WFL in PF in sitting, and 4 to 4+/5 in inversion for improved performance of functional mobility skills and to assist with returning to hobbies and recreational activities. Baseline:  Goal status: INITIAL  6.   Pt will demo 10 symmetrical bilat heel raises without significant pain for improved strength.  Baseline:  Goal status: INITIAL Target date:  04/11/2024    PLAN:  PT FREQUENCY: 1x/4-6 weeks and 2x/wk  afterwards  PT DURATION: other: 16 weeks  PLANNED INTERVENTIONS: 97164- PT Re-evaluation, 97750- Physical Performance Testing, 97110-Therapeutic exercises, 97530- Therapeutic activity, [id number]- Neuromuscular re-education, 97535- Self Care, 78295- Manual therapy, [phone]- Gait training, [phone]- Aquatic Therapy, [phone]- Electrical stimulation (unattended), [phone]- Electrical stimulation (manual), 97016- Vasopneumatic device, 97035- Ultrasound, Patient/Family education, Balance training, Stair training, Taping, Dry Needling, Joint mobilization, Scar mobilization, DME instructions, Cryotherapy, and Moist heat  PLAN FOR NEXT SESSION: Cont per surgical protocol and MD orders.     Herb Loges, PTA  02/01/24 1:17 PM

## 2024-02-08 NOTE — Therapy (Signed)
 OUTPATIENT PHYSICAL THERAPY LOWER EXTREMITY TREATMENT   Patient Name: Jessica Graham MRN: [id number] DOB:[birth year], 38 y.o., female Today's Date: 02/10/2024  END OF SESSION:  PT End of Session - 02/09/24 0947     Visit Number 5    Number of Visits 26    Date for PT Re-Evaluation 04/04/24    Authorization Type BCBS    PT Start Time 0940    PT Stop Time 1014    PT Time Calculation (min) 34 min    Activity Tolerance Patient tolerated treatment well    Behavior During Therapy [facility] for tasks assessed/performed                 Past Medical History:  Diagnosis Date   ADD (attention deficit disorder)    Anxiety    Depression    Fatigue 10/30/2021   Gestational hypertension    Poor motivation 01/30/2023   Recent skin changes 12/15/2022   Tachycardia, unspecified 02/23/2023   Past Surgical History:  Procedure Laterality Date   LAPAROSCOPIC APPENDECTOMY N/A 10/19/2020   Procedure: APPENDECTOMY LAPAROSCOPIC;  Surgeon: Shela Derby, MD;  Location: [facility] OR;  Service: General;  Laterality: N/A;   TONSILLECTOMY  2008   Patient Active Problem List   Diagnosis Date Noted   Injury of left ankle 12/14/2023   Encounter for annual physical exam 12/11/2023   Breast nodule 12/11/2023   Asthmatic bronchitis 03/13/2023   Depression, recurrent (HCC) 02/23/2023   Leukocytosis 02/23/2023   Tremors of nervous system 12/15/2022   Pain of left thumb 12/15/2022   Breakthrough bleeding on birth control pills 10/30/2021   Attention deficit hyperactivity disorder, predominantly inattentive type 03/07/2015     REFERRING PROVIDER: Wilhelmenia Harada, MD  REFERRING DIAG: [id number] (ICD-10-CM) - Other instability, left ankle  THERAPY DIAG:  Pain in left ankle and joints of left foot  Stiffness of left ankle, not elsewhere classified  Muscle weakness (generalized)  Difficulty in walking, not elsewhere classified  Rationale for Evaluation and Treatment: Rehabilitation  ONSET DATE: DOS  01/07/2024  SUBJECTIVE:   SUBJECTIVE STATEMENT: Pt is 4 weeks and 5 days s/p Left ankle Brostrom repair with internal brace.  Pt denies pain currently.  Pt states her stamina is less.  Pt able to garden for 15 mins without pain.  Pt has been performing her HEP until last week though has not been performing them this week.  Pt has been taking care of her child.  Pt has walked on TM at a slow pace.       Pt arrives with sandals donned.     PERTINENT HISTORY: Left ankle Brostrom repair with internal brace on 01/07/24 possible L knee LCL type sprain from 09/18/23 injury per MD note  ADD, mild depression, tachycardia which may be due to medication  PAIN:  NPRS:  0/10 current, 5/10 worst, 0-1/10 best Location:  anterolateral ankle.  It feels a little irriated around the incision.  It feels more exterior than interior.   PRECAUTIONS: Other: per surgical protocol    WEIGHT BEARING RESTRICTIONS: Yes WBAT Left leg in CAM boot  FALLS:  Has patient fallen in last 6 months? Yes. Number of falls a few falls due to rolling ankle  LIVING ENVIRONMENT: Lives with: lives with their spouse and lives with their daughter Lives in:  1 story home Stairs:  4 steps to enter home without rail Has following equipment at home: Crutches and CAM walker boot  OCCUPATION:   PLOF: Independent  skateboarding and gardening  PATIENT GOALS: return to PLOF  NEXT MD VISIT: 01/21/2024  OBJECTIVE:  Note: Objective measures were completed at Evaluation unless otherwise noted.  DIAGNOSTIC FINDINGS: Pt is post op.  She had x rays and MRI's prior to surgery.  See Epic for MRI's.   PATIENT SURVEYS:  LEFS:  49/80  COGNITION: Overall cognitive status: Within functional limits for tasks assessed          GAIT: Level of assistance: Independent Comments:  Pt reports no increased pain with ambulation without crutch.                                                                                                                                  TREATMENT:     5/27:  Reviewed pt presentation, pain level, HEP compliance, and response to prior Rx.  Incision closed and healing well.  Incision has a normal purple color with no drainage.  Supine SLR 3x10 Toe flex/ext AROM 2x10 Ankle DF/PF AROM 3x10 Towel scrunches Toe yoga 2x10    Pt received gentle DF/PF PROM w/n pt and tissue tolerance.  Updated HEP and gave pt a HEP handout.  PT educated pt in correct form and appropriate frequency.  PT instructed pt in appropriate ROM including to not force PF and not perform into a tight range.  PT instructed pt to not perform DF and PF in a painful range.    5/19: PROM L ankle DF, PF, eversion Isometric eversion, DF 5" x10ea Bridge with LES on physioball 3" 3x10 Sidelying hip abduction 2x15ea Sit to stands x30 Seated toe scrunches x30 Seated HR/TR x20ea Seated toe yoga x30ea Standing calf stretch 3x30sec HEP update/review    Previous: LEFS:  49/80   quad sets with 5 sec hold in boot x10 with towel under knee  supine SLR 3x10 reps      Toe flexion and extension AROM 3x10 Toe spreads 3x10             Ankle active DF ROM 2x10   Pt received gentle DF PROM w/n pt and tissue tolerance.  Incision has no stitches and looks good.  Incision is intact and pt has no signs of infection.  Steri strips have all fallen off.     PATIENT EDUCATION:  Education details: post op and protocol restrictions and limitations, exercise form, HEP, relevant anatomy, and dx.  PT educated pt in proper footwear.  PT answered pt's questions.   Person educated: Patient Education method: Explanation, Demonstration, Tactile cues, Verbal cues, and Handouts Education comprehension: verbalized understanding, returned demonstration, verbal cues required, tactile cues required, and needs further education  HOME EXERCISE PROGRAM: Access Code: [email or web address] URL: [email or web address] Date: 01/11/2024 Prepared by:  Marnie Siren  Updated HEP: - Supine Active Ankle Pumps  - 2 x daily - 7 x weekly - 2 sets - 10 reps - Seated Toe Towel Scrunches  - 1 x daily - 7 x weekly - 2-3 sets -  10 reps   ASSESSMENT:  CLINICAL IMPRESSION: Pt is making great progress.  She presented to Rx with sandals on.  PT educated pt in appropriate foot wear.  Pt states her shoe felt like it was rubbing the incision.  Pt is ambulating without boot without adverse effects.  Pt is progressing well with ankle ROM per protocol.  She performed exercises per protocol well without c/o's.  PT updated HEP and educated pt concerning HEP including appropriate ROM.  Pt demonstrated good understanding.  She responded well to Rx reporting no pain and having no c/o's after Rx.    OBJECTIVE IMPAIRMENTS: Abnormal gait, decreased activity tolerance, decreased mobility, difficulty walking, decreased ROM, decreased strength, hypomobility, and pain.   ACTIVITY LIMITATIONS: standing, squatting, sleeping, stairs, transfers, and locomotion level  PARTICIPATION LIMITATIONS: meal prep, cleaning, laundry, driving, shopping, community activity, occupation, and yard work  PERSONAL FACTORS:   REHAB POTENTIAL: Good  CLINICAL DECISION MAKING: Stable/uncomplicated  EVALUATION COMPLEXITY: Low   GOALS:   SHORT TERM GOALS:  Pt will be independent and compliant with HEP for improved ROM, strength, mobility, and function.  Baseline: Goal status: INITIAL Target date:  02/15/2024    2.  Pt will progress and tolerate ankle ROM as appropriate per protocol without adverse effects for improved stiffness, ROM, and mobility.  Baseline:  Goal status: INITIAL Target date:  02/29/2024   3.  Pt will wean out of boot as allowed by MD without adverse effects.  Baseline:  Goal status: INITIAL Target date:  6/16 - 03/07/2024  4.  Pt will ambulate with a normalized heel to toe gait without limping. Baseline:  Goal status: INITIAL Target date:  03/28/2024   5.   Pt will demo at least 10 deg of DF AROM for improved gait and stiffness. Baseline:  Goal status: INITIAL Target date:  03/21/2024    LONG TERM GOALS: Target date: 05/02/2024   Pt will demo R ankle AROM to be [facility] t/o for performance of ADLs and IADLs  Baseline:  Goal status: INITIAL Target date:  04/11/2024    2.   Pt will ambulate extended community distance without increased pain and with good stability.  Baseline:  Goal status: INITIAL  3.  Pt will be able to perform her ADL's/IADL's without significant pain and difficulty.  Baseline:  Goal status: INITIAL  4.  Pt will be to ascend and descend stairs with a reciprocal gait with good control with the rail.  Baseline:  Goal status: INITIAL  5.    Pt will demo 5/5 strength in DF and eversion, WFL in PF in sitting, and 4 to 4+/5 in inversion for improved performance of functional mobility skills and to assist with returning to hobbies and recreational activities. Baseline:  Goal status: INITIAL  6.   Pt will demo 10 symmetrical bilat heel raises without significant pain for improved strength.  Baseline:  Goal status: INITIAL Target date:  04/11/2024    PLAN:  PT FREQUENCY: 1x/4-6 weeks and 2x/wk afterwards  PT DURATION: other: 16 weeks  PLANNED INTERVENTIONS: 97164- PT Re-evaluation, 97750- Physical Performance Testing, 97110-Therapeutic exercises, 97530- Therapeutic activity, [id number]- Neuromuscular re-education, 97535- Self Care, 16109- Manual therapy, [phone]- Gait training, [phone]- Aquatic Therapy, [phone]- Electrical stimulation (unattended), [phone]- Electrical stimulation (manual), 97016- Vasopneumatic device, 97035- Ultrasound, Patient/Family education, Balance training, Stair training, Taping, Dry Needling, Joint mobilization, Scar mobilization, DME instructions, Cryotherapy, and Moist heat  PLAN FOR NEXT SESSION: Cont per surgical protocol and MD orders.     Trina Fujita  III PT, DPT 02/10/24 10:02 AM

## 2024-02-09 ENCOUNTER — Ambulatory Visit (HOSPITAL_BASED_OUTPATIENT_CLINIC_OR_DEPARTMENT_OTHER): Admitting: Physical Therapy

## 2024-02-09 ENCOUNTER — Encounter (HOSPITAL_BASED_OUTPATIENT_CLINIC_OR_DEPARTMENT_OTHER): Payer: Self-pay | Admitting: Physical Therapy

## 2024-02-09 DIAGNOSIS — M6281 Muscle weakness (generalized): Secondary | ICD-10-CM

## 2024-02-09 DIAGNOSIS — R262 Difficulty in walking, not elsewhere classified: Secondary | ICD-10-CM | POA: Diagnosis not present

## 2024-02-09 DIAGNOSIS — M25672 Stiffness of left ankle, not elsewhere classified: Secondary | ICD-10-CM

## 2024-02-09 DIAGNOSIS — M25572 Pain in left ankle and joints of left foot: Secondary | ICD-10-CM | POA: Diagnosis not present

## 2024-02-10 ENCOUNTER — Encounter (HOSPITAL_BASED_OUTPATIENT_CLINIC_OR_DEPARTMENT_OTHER): Payer: Self-pay | Admitting: Orthopaedic Surgery

## 2024-02-11 ENCOUNTER — Encounter (HOSPITAL_BASED_OUTPATIENT_CLINIC_OR_DEPARTMENT_OTHER): Payer: Self-pay

## 2024-02-11 ENCOUNTER — Other Ambulatory Visit: Payer: Self-pay | Admitting: Nurse Practitioner

## 2024-02-11 ENCOUNTER — Ambulatory Visit (HOSPITAL_BASED_OUTPATIENT_CLINIC_OR_DEPARTMENT_OTHER)

## 2024-02-11 DIAGNOSIS — R262 Difficulty in walking, not elsewhere classified: Secondary | ICD-10-CM | POA: Diagnosis not present

## 2024-02-11 DIAGNOSIS — M25572 Pain in left ankle and joints of left foot: Secondary | ICD-10-CM | POA: Diagnosis not present

## 2024-02-11 DIAGNOSIS — M6281 Muscle weakness (generalized): Secondary | ICD-10-CM

## 2024-02-11 DIAGNOSIS — M25672 Stiffness of left ankle, not elsewhere classified: Secondary | ICD-10-CM

## 2024-02-11 DIAGNOSIS — F900 Attention-deficit hyperactivity disorder, predominantly inattentive type: Secondary | ICD-10-CM

## 2024-02-11 NOTE — Telephone Encounter (Signed)
 Last apt 12/11/23 for an acute visit. Pt. Sent message to schedule a CPE or med check.

## 2024-02-11 NOTE — Therapy (Signed)
 OUTPATIENT PHYSICAL THERAPY LOWER EXTREMITY TREATMENT   Patient Name: Terie Lear MRN: [id number] DOB:Jun 23, 1986, 38 y.o., female Today's Date: 02/12/2024  END OF SESSION:  PT End of Session - 02/11/24 1001     Visit Number 6    Number of Visits 26    Date for PT Re-Evaluation 04/04/24    Authorization Type BCBS    PT Start Time 0934    PT Stop Time 1015    PT Time Calculation (min) 41 min    Activity Tolerance Patient tolerated treatment well    Behavior During Therapy [facility] for tasks assessed/performed                  Past Medical History:  Diagnosis Date   ADD (attention deficit disorder)    Anxiety    Depression    Fatigue 10/30/2021   Gestational hypertension    Poor motivation 01/30/2023   Recent skin changes 12/15/2022   Tachycardia, unspecified 02/23/2023   Past Surgical History:  Procedure Laterality Date   LAPAROSCOPIC APPENDECTOMY N/A 10/19/2020   Procedure: APPENDECTOMY LAPAROSCOPIC;  Surgeon: Shela Derby, MD;  Location: [facility] OR;  Service: General;  Laterality: N/A;   TONSILLECTOMY  2008   Patient Active Problem List   Diagnosis Date Noted   Injury of left ankle 12/14/2023   Encounter for annual physical exam 12/11/2023   Breast nodule 12/11/2023   Asthmatic bronchitis 03/13/2023   Depression, recurrent (HCC) 02/23/2023   Leukocytosis 02/23/2023   Tremors of nervous system 12/15/2022   Pain of left thumb 12/15/2022   Breakthrough bleeding on birth control pills 10/30/2021   Attention deficit hyperactivity disorder, predominantly inattentive type 03/07/2015     REFERRING PROVIDER: Wilhelmenia Harada, MD  REFERRING DIAG: [phone] (ICD-10-CM) - Other instability, left ankle  THERAPY DIAG:  Difficulty in walking, not elsewhere classified  Muscle weakness (generalized)  Stiffness of left ankle, not elsewhere classified  Pain in left ankle and joints of left foot  Rationale for Evaluation and Treatment: Rehabilitation  ONSET DATE: DOS  01/07/2024  SUBJECTIVE:   SUBJECTIVE STATEMENT: Pt reports no pain in ankle. She was cleared to swim by MD. Plans to try this soon for cardio.     PERTINENT HISTORY: Left ankle Brostrom repair with internal brace on 01/07/24 possible L knee LCL type sprain from 09/18/23 injury per MD note  ADD, mild depression, tachycardia which may be due to medication  PAIN:  NPRS:  0/10 current, 5/10 worst, 0-1/10 best Location:  anterolateral ankle.  It feels a little irriated around the incision.  It feels more exterior than interior.   PRECAUTIONS: Other: per surgical protocol    WEIGHT BEARING RESTRICTIONS: Yes WBAT Left leg in CAM boot  FALLS:  Has patient fallen in last 6 months? Yes. Number of falls a few falls due to rolling ankle  LIVING ENVIRONMENT: Lives with: lives with their spouse and lives with their daughter Lives in:  1 story home Stairs:  4 steps to enter home without rail Has following equipment at home: Crutches and CAM walker boot  OCCUPATION:   PLOF: Independent  skateboarding and gardening   PATIENT GOALS: return to PLOF  NEXT MD VISIT: 01/21/2024  OBJECTIVE:  Note: Objective measures were completed at Evaluation unless otherwise noted.  DIAGNOSTIC FINDINGS: Pt is post op.  She had x rays and MRI's prior to surgery.  See Epic for MRI's.   PATIENT SURVEYS:  LEFS:  49/80  COGNITION: Overall cognitive status: Within functional limits for tasks assessed  GAIT: Level of assistance: Independent Comments:  Pt reports no increased pain with ambulation without crutch.                                                                                                                                 TREATMENT:    5/29:  PROM L ankle DF/PF  Ankle isometrics PF,DF,eversion Supine SLR 3x10 S/l hip abduction 3x10 Bridge with physioball 2x10 HSC with physioball 2x10 Ankle DF/PF AROM 3x10 Towel scrunches x30 Toe yoga x30ea  Upright Bike  L3   5/27:  Reviewed pt presentation, pain level, HEP compliance, and response to prior Rx.  Incision closed and healing well.  Incision has a normal purple color with no drainage.  Supine SLR 3x10 Toe flex/ext AROM 2x10 Ankle DF/PF AROM 3x10 Towel scrunches Toe yoga 2x10    Pt received gentle DF/PF PROM w/n pt and tissue tolerance.  Updated HEP and gave pt a HEP handout.  PT educated pt in correct form and appropriate frequency.  PT instructed pt in appropriate ROM including to not force PF and not perform into a tight range.  PT instructed pt to not perform DF and PF in a painful range.    5/19: PROM L ankle DF, PF, eversion Isometric eversion, DF 5" x10ea Bridge with LES on physioball 3" 3x10 Sidelying hip abduction 2x15ea Sit to stands x30 Seated toe scrunches x30 Seated HR/TR x20ea Seated toe yoga x30ea Standing calf stretch 3x30sec HEP update/review    Previous: LEFS:  49/80   quad sets with 5 sec hold in boot x10 with towel under knee  supine SLR 3x10 reps      Toe flexion and extension AROM 3x10 Toe spreads 3x10             Ankle active DF ROM 2x10   Pt received gentle DF PROM w/n pt and tissue tolerance.  Incision has no stitches and looks good.  Incision is intact and pt has no signs of infection.  Steri strips have all fallen off.     PATIENT EDUCATION:  Education details: post op and protocol restrictions and limitations, exercise form, HEP, relevant anatomy, and dx.  PT educated pt in proper footwear.  PT answered pt's questions.   Person educated: Patient Education method: Explanation, Demonstration, Tactile cues, Verbal cues, and Handouts Education comprehension: verbalized understanding, returned demonstration, verbal cues required, tactile cues required, and needs further education  HOME EXERCISE PROGRAM: Access Code: [email or web address] URL: [email or web address] Date: 01/11/2024 Prepared by: Marnie Siren  Updated HEP: - Supine  Active Ankle Pumps  - 2 x daily - 7 x weekly - 2 sets - 10 reps - Seated Toe Towel Scrunches  - 1 x daily - 7 x weekly - 2-3 sets - 10 reps   ASSESSMENT:  CLINICAL IMPRESSION: Pt is now 5 weeks s/p. Has been cleared to swim by MD and plans to swim soon. Cautioned pt to be aware  of resistance from water and avoid inversion based movements. Good tolerance for PROM and isometrics in allowbale ranges. Challenged by proximal joint segment strengthening. Trialled upright bike which she did have general fatigue with, though no pain.   OBJECTIVE IMPAIRMENTS: Abnormal gait, decreased activity tolerance, decreased mobility, difficulty walking, decreased ROM, decreased strength, hypomobility, and pain.   ACTIVITY LIMITATIONS: standing, squatting, sleeping, stairs, transfers, and locomotion level  PARTICIPATION LIMITATIONS: meal prep, cleaning, laundry, driving, shopping, community activity, occupation, and yard work  PERSONAL FACTORS:   REHAB POTENTIAL: Good  CLINICAL DECISION MAKING: Stable/uncomplicated  EVALUATION COMPLEXITY: Low   GOALS:   SHORT TERM GOALS:  Pt will be independent and compliant with HEP for improved ROM, strength, mobility, and function.  Baseline: Goal status: MET 5/29 Target date:  02/15/2024    2.  Pt will progress and tolerate ankle ROM as appropriate per protocol without adverse effects for improved stiffness, ROM, and mobility.  Baseline:  Goal status: INITIAL Target date:  02/29/2024   3.  Pt will wean out of boot as allowed by MD without adverse effects.  Baseline:  Goal status: MET 5/29 Target date:  6/16 - 03/07/2024  4.  Pt will ambulate with a normalized heel to toe gait without limping. Baseline:  Goal status: INITIAL Target date:  03/28/2024   5.  Pt will demo at least 10 deg of DF AROM for improved gait and stiffness. Baseline:  Goal status: INITIAL Target date:  03/21/2024    LONG TERM GOALS: Target date: 05/02/2024   Pt will demo R ankle  AROM to be [facility] t/o for performance of ADLs and IADLs  Baseline:  Goal status: INITIAL Target date:  04/11/2024    2.   Pt will ambulate extended community distance without increased pain and with good stability.  Baseline:  Goal status: INITIAL  3.  Pt will be able to perform her ADL's/IADL's without significant pain and difficulty.  Baseline:  Goal status: INITIAL  4.  Pt will be to ascend and descend stairs with a reciprocal gait with good control with the rail.  Baseline:  Goal status: INITIAL  5.    Pt will demo 5/5 strength in DF and eversion, WFL in PF in sitting, and 4 to 4+/5 in inversion for improved performance of functional mobility skills and to assist with returning to hobbies and recreational activities. Baseline:  Goal status: INITIAL  6.   Pt will demo 10 symmetrical bilat heel raises without significant pain for improved strength.  Baseline:  Goal status: INITIAL Target date:  04/11/2024    PLAN:  PT FREQUENCY: 1x/4-6 weeks and 2x/wk afterwards  PT DURATION: other: 16 weeks  PLANNED INTERVENTIONS: 97164- PT Re-evaluation, 97750- Physical Performance Testing, 97110-Therapeutic exercises, 97530- Therapeutic activity, [id number]- Neuromuscular re-education, 97535- Self Care, 13086- Manual therapy, [phone]- Gait training, [phone]- Aquatic Therapy, [phone]- Electrical stimulation (unattended), [phone]- Electrical stimulation (manual), 97016- Vasopneumatic device, 97035- Ultrasound, Patient/Family education, Balance training, Stair training, Taping, Dry Needling, Joint mobilization, Scar mobilization, DME instructions, Cryotherapy, and Moist heat  PLAN FOR NEXT SESSION: Cont per surgical protocol and MD orders.     Herb Loges, PTA  02/12/24 8:10 AM

## 2024-02-12 ENCOUNTER — Other Ambulatory Visit (HOSPITAL_BASED_OUTPATIENT_CLINIC_OR_DEPARTMENT_OTHER): Payer: Self-pay

## 2024-02-12 MED ORDER — LISDEXAMFETAMINE DIMESYLATE 70 MG PO CAPS: 70.0000 mg | ORAL_CAPSULE | Freq: Every day | ORAL | 0 refills | Status: DC

## 2024-02-12 MED ORDER — LISDEXAMFETAMINE DIMESYLATE 70 MG PO CAPS
70.0000 mg | ORAL_CAPSULE | Freq: Every day | ORAL | 0 refills | Status: DC
Start: 2024-03-11 — End: 2024-04-11
  Filled 2024-03-14: qty 30, 30d supply, fill #0

## 2024-02-12 MED ORDER — LISDEXAMFETAMINE DIMESYLATE 70 MG PO CAPS
70.0000 mg | ORAL_CAPSULE | Freq: Every day | ORAL | 0 refills | Status: DC
Start: 2024-02-12 — End: 2024-05-13
  Filled 2024-02-12: qty 30, 30d supply, fill #0

## 2024-02-12 MED ORDER — AMPHETAMINE-DEXTROAMPHETAMINE 20 MG PO TABS
20.0000 mg | ORAL_TABLET | Freq: Two times a day (BID) | ORAL | 0 refills | Status: DC
  Filled 2024-03-14: qty 60, 30d supply, fill #0

## 2024-02-12 MED ORDER — AMPHETAMINE-DEXTROAMPHETAMINE 20 MG PO TABS
20.0000 mg | ORAL_TABLET | Freq: Two times a day (BID) | ORAL | 0 refills | Status: DC
Start: 2024-02-12 — End: 2024-05-13
  Filled 2024-02-12: qty 60, 30d supply, fill #0

## 2024-02-12 MED ORDER — AMPHETAMINE-DEXTROAMPHETAMINE 20 MG PO TABS
20.0000 mg | ORAL_TABLET | Freq: Two times a day (BID) | ORAL | 0 refills | Status: DC
  Filled 2024-04-11: qty 60, 30d supply, fill #0

## 2024-02-15 NOTE — Therapy (Signed)
 OUTPATIENT PHYSICAL THERAPY LOWER EXTREMITY TREATMENT   Patient Name: Jessica Graham MRN: [id number] DOB:Feb 08, 1986, 38 y.o., female Today's Date: 02/17/2024  END OF SESSION:  PT End of Session - 02/16/24 0956     Visit Number 7    Number of Visits 26    Date for PT Re-Evaluation 04/04/24    Authorization Type BCBS    PT Start Time 0953    PT Stop Time 1028    PT Time Calculation (min) 35 min    Activity Tolerance Patient tolerated treatment well    Behavior During Therapy [facility] for tasks assessed/performed                   Past Medical History:  Diagnosis Date   ADD (attention deficit disorder)    Anxiety    Depression    Fatigue 10/30/2021   Gestational hypertension    Poor motivation 01/30/2023   Recent skin changes 12/15/2022   Tachycardia, unspecified 02/23/2023   Past Surgical History:  Procedure Laterality Date   LAPAROSCOPIC APPENDECTOMY N/A 10/19/2020   Procedure: APPENDECTOMY LAPAROSCOPIC;  Surgeon: Shela Derby, MD;  Location: [facility] OR;  Service: General;  Laterality: N/A;   TONSILLECTOMY  2008   Patient Active Problem List   Diagnosis Date Noted   Injury of left ankle 12/14/2023   Encounter for annual physical exam 12/11/2023   Breast nodule 12/11/2023   Asthmatic bronchitis 03/13/2023   Depression, recurrent (HCC) 02/23/2023   Leukocytosis 02/23/2023   Tremors of nervous system 12/15/2022   Pain of left thumb 12/15/2022   Breakthrough bleeding on birth control pills 10/30/2021   Attention deficit hyperactivity disorder, predominantly inattentive type 03/07/2015     REFERRING PROVIDER: Wilhelmenia Harada, MD  REFERRING DIAG: [phone] (ICD-10-CM) - Other instability, left ankle  THERAPY DIAG:  Pain in left ankle and joints of left foot  Stiffness of left ankle, not elsewhere classified  Muscle weakness (generalized)  Difficulty in walking, not elsewhere classified  Rationale for Evaluation and Treatment: Rehabilitation  ONSET DATE: DOS  01/07/2024  SUBJECTIVE:   SUBJECTIVE STATEMENT: Pt is 5 weeks and 5 days post op.  Pt states her ankle is feeling better and she is feeling more normal.  Pt denies pain currently.  Pt reports compliance with HEP and has no problems with her HEP.     PERTINENT HISTORY: Left ankle Brostrom repair with internal brace on 01/07/24 possible L knee LCL type sprain from 09/18/23 injury per MD note  ADD, mild depression, tachycardia which may be due to medication  PAIN:  NPRS:  0/10 current, 5/10 worst, 0-1/10 best Location:  anterolateral ankle.  It feels a little irriated around the incision.  It feels more exterior than interior.   PRECAUTIONS: Other: per surgical protocol    WEIGHT BEARING RESTRICTIONS: Yes WBAT Left leg in CAM boot  FALLS:  Has patient fallen in last 6 months? Yes. Number of falls a few falls due to rolling ankle  LIVING ENVIRONMENT: Lives with: lives with their spouse and lives with their daughter Lives in:  1 story home Stairs:  4 steps to enter home without rail Has following equipment at home: Crutches and CAM walker boot  OCCUPATION:   PLOF: Independent  skateboarding and gardening   PATIENT GOALS: return to PLOF  NEXT MD VISIT: 01/21/2024  OBJECTIVE:  Note: Objective measures were completed at Evaluation unless otherwise noted.  DIAGNOSTIC FINDINGS: Pt is post op.  She had x rays and MRI's prior to surgery.  See  Epic for MRI's.   PATIENT SURVEYS:  LEFS:  49/80  COGNITION: Overall cognitive status: Within functional limits for tasks assessed          GAIT: Level of assistance: Independent Comments:  Pt reports no increased pain with ambulation without crutch.                                                                                                                                 TREATMENT:    6/3 Supine SLR 3x10 with 2# weight above knee S/L hip abduction 3x10 Ankle pumps 3x10 PROM L ankle DF/PF  Towel scrunches x30 Toe yoga  x30ea  Bridge with physioball 2x10 Ankle submaximal isometrics x 10 reps with 5 sec hold in PF and DF  L ankle DF AROM:  12 deg  R ankle DF AROM:  20 deg  Pt had one area of redness with a scab at mid incision.  Pt states the shoes may have rubbed it.  PT instructed pt to monitor area and educated pt in proper footwear.   5/29:  PROM L ankle DF/PF  Ankle isometrics PF,DF,eversion Supine SLR 3x10 S/l hip abduction 3x10 Bridge with physioball 2x10 HSC with physioball 2x10 Ankle DF/PF AROM 3x10 Towel scrunches x30 Toe yoga x30ea  Upright Bike L3   5/27:  Reviewed pt presentation, pain level, HEP compliance, and response to prior Rx.  Incision closed and healing well.  Incision has a normal purple color with no drainage.  Supine SLR 3x10 Toe flex/ext AROM 2x10 Ankle DF/PF AROM 3x10 Towel scrunches Toe yoga 2x10    Pt received gentle DF/PF PROM w/n pt and tissue tolerance.  Updated HEP and gave pt a HEP handout.  PT educated pt in correct form and appropriate frequency.  PT instructed pt in appropriate ROM including to not force PF and not perform into a tight range.  PT instructed pt to not perform DF and PF in a painful range.   PATIENT EDUCATION:  Education details: post op and protocol restrictions and limitations, exercise form, HEP, relevant anatomy, and dx.  PT educated pt in proper footwear.  PT answered pt's questions.   Person educated: Patient Education method: Explanation, Demonstration, Tactile cues, Verbal cues, and Handouts Education comprehension: verbalized understanding, returned demonstration, verbal cues required, tactile cues required, and needs further education  HOME EXERCISE PROGRAM: Access Code: [email or web address] URL: [email or web address] Date: 01/11/2024 Prepared by: Marnie Siren    ASSESSMENT:  CLINICAL IMPRESSION: Pt is making good progress.  She continues to progress well with protocol and ankle ROM per protocol.  PT  assessed DF AROM and pt has 12 degrees.  Pt had no pain with DF and PF submaximal isometrics and had good tolerance with all exercises.  Pt has one area of redness with a scab at mid incision which pt thinks is from her shoe rubbing it.  PT instructed pt to monitor area and educated pt in proper  footwear.  Pt responded well to Rx reporting no pain and having no c/o's after Rx.  Pt will benefit from cont skilled PT per protocol to address goals and impairments and improve overall function.    OBJECTIVE IMPAIRMENTS: Abnormal gait, decreased activity tolerance, decreased mobility, difficulty walking, decreased ROM, decreased strength, hypomobility, and pain.   ACTIVITY LIMITATIONS: standing, squatting, sleeping, stairs, transfers, and locomotion level  PARTICIPATION LIMITATIONS: meal prep, cleaning, laundry, driving, shopping, community activity, occupation, and yard work  PERSONAL FACTORS:   REHAB POTENTIAL: Good  CLINICAL DECISION MAKING: Stable/uncomplicated  EVALUATION COMPLEXITY: Low   GOALS:   SHORT TERM GOALS:  Pt will be independent and compliant with HEP for improved ROM, strength, mobility, and function.  Baseline: Goal status: MET 5/29 Target date:  02/15/2024    2.  Pt will progress and tolerate ankle ROM as appropriate per protocol without adverse effects for improved stiffness, ROM, and mobility.  Baseline:  Goal status: INITIAL Target date:  02/29/2024   3.  Pt will wean out of boot as allowed by MD without adverse effects.  Baseline:  Goal status: MET 5/29 Target date:  6/16 - 03/07/2024  4.  Pt will ambulate with a normalized heel to toe gait without limping. Baseline:  Goal status: INITIAL Target date:  03/28/2024   5.  Pt will demo at least 10 deg of DF AROM for improved gait and stiffness. Baseline:  Goal status: INITIAL Target date:  03/21/2024    LONG TERM GOALS: Target date: 05/02/2024   Pt will demo R ankle AROM to be [facility] t/o for performance of ADLs  and IADLs  Baseline:  Goal status: INITIAL Target date:  04/11/2024    2.   Pt will ambulate extended community distance without increased pain and with good stability.  Baseline:  Goal status: INITIAL  3.  Pt will be able to perform her ADL's/IADL's without significant pain and difficulty.  Baseline:  Goal status: INITIAL  4.  Pt will be to ascend and descend stairs with a reciprocal gait with good control with the rail.  Baseline:  Goal status: INITIAL  5.    Pt will demo 5/5 strength in DF and eversion, WFL in PF in sitting, and 4 to 4+/5 in inversion for improved performance of functional mobility skills and to assist with returning to hobbies and recreational activities. Baseline:  Goal status: INITIAL  6.   Pt will demo 10 symmetrical bilat heel raises without significant pain for improved strength.  Baseline:  Goal status: INITIAL Target date:  04/11/2024    PLAN:  PT FREQUENCY: 1x/4-6 weeks and 2x/wk afterwards  PT DURATION: other: 16 weeks  PLANNED INTERVENTIONS: 97164- PT Re-evaluation, 97750- Physical Performance Testing, 97110-Therapeutic exercises, 97530- Therapeutic activity, [id number]- Neuromuscular re-education, 97535- Self Care, 16109- Manual therapy, [phone]- Gait training, [phone]- Aquatic Therapy, [phone]- Electrical stimulation (unattended), [phone]- Electrical stimulation (manual), 97016- Vasopneumatic device, 97035- Ultrasound, Patient/Family education, Balance training, Stair training, Taping, Dry Needling, Joint mobilization, Scar mobilization, DME instructions, Cryotherapy, and Moist heat  PLAN FOR NEXT SESSION: Cont per surgical protocol and MD orders.     Trina Fujita III PT, DPT 02/17/24 5:10 PM

## 2024-02-16 ENCOUNTER — Encounter (HOSPITAL_BASED_OUTPATIENT_CLINIC_OR_DEPARTMENT_OTHER): Payer: Self-pay | Admitting: Physical Therapy

## 2024-02-16 ENCOUNTER — Ambulatory Visit (HOSPITAL_BASED_OUTPATIENT_CLINIC_OR_DEPARTMENT_OTHER): Attending: Orthopaedic Surgery | Admitting: Physical Therapy

## 2024-02-16 DIAGNOSIS — M6281 Muscle weakness (generalized): Secondary | ICD-10-CM | POA: Diagnosis not present

## 2024-02-16 DIAGNOSIS — M25572 Pain in left ankle and joints of left foot: Secondary | ICD-10-CM | POA: Diagnosis not present

## 2024-02-16 DIAGNOSIS — M25672 Stiffness of left ankle, not elsewhere classified: Secondary | ICD-10-CM | POA: Diagnosis not present

## 2024-02-16 DIAGNOSIS — R262 Difficulty in walking, not elsewhere classified: Secondary | ICD-10-CM | POA: Insufficient documentation

## 2024-02-18 ENCOUNTER — Ambulatory Visit (HOSPITAL_BASED_OUTPATIENT_CLINIC_OR_DEPARTMENT_OTHER): Admitting: Physical Therapy

## 2024-02-18 NOTE — Therapy (Incomplete)
 OUTPATIENT PHYSICAL THERAPY LOWER EXTREMITY TREATMENT   Patient Name: Jessica Graham MRN: [id number] DOB:1986/08/04, 38 y.o., female Today's Date: 02/18/2024  END OF SESSION:          Past Medical History:  Diagnosis Date   ADD (attention deficit disorder)    Anxiety    Depression    Fatigue 10/30/2021   Gestational hypertension    Poor motivation 01/30/2023   Recent skin changes 12/15/2022   Tachycardia, unspecified 02/23/2023   Past Surgical History:  Procedure Laterality Date   LAPAROSCOPIC APPENDECTOMY N/A 10/19/2020   Procedure: APPENDECTOMY LAPAROSCOPIC;  Surgeon: Shela Derby, MD;  Location: [facility] OR;  Service: General;  Laterality: N/A;   TONSILLECTOMY  2008   Patient Active Problem List   Diagnosis Date Noted   Injury of left ankle 12/14/2023   Encounter for annual physical exam 12/11/2023   Breast nodule 12/11/2023   Asthmatic bronchitis 03/13/2023   Depression, recurrent (HCC) 02/23/2023   Leukocytosis 02/23/2023   Tremors of nervous system 12/15/2022   Pain of left thumb 12/15/2022   Breakthrough bleeding on birth control pills 10/30/2021   Attention deficit hyperactivity disorder, predominantly inattentive type 03/07/2015     REFERRING PROVIDER: Wilhelmenia Harada, MD  REFERRING DIAG: [phone] (ICD-10-CM) - Other instability, left ankle  THERAPY DIAG:  No diagnosis found.  Rationale for Evaluation and Treatment: Rehabilitation  ONSET DATE: DOS 01/07/2024  SUBJECTIVE:   SUBJECTIVE STATEMENT: Pt is 6 weeks.  Pt states her ankle is feeling better and she is feeling more normal.  Pt denies pain currently.  Pt reports compliance with HEP and has no problems with her HEP.     PERTINENT HISTORY: Left ankle Brostrom repair with internal brace on 01/07/24 possible L knee LCL type sprain from 09/18/23 injury per MD note  ADD, mild depression, tachycardia which may be due to medication  PAIN:  NPRS:  0/10 current, 5/10 worst, 0-1/10 best Location:   anterolateral ankle.  It feels a little irriated around the incision.  It feels more exterior than interior.   PRECAUTIONS: Other: per surgical protocol    WEIGHT BEARING RESTRICTIONS: Yes WBAT Left leg in CAM boot  FALLS:  Has patient fallen in last 6 months? Yes. Number of falls a few falls due to rolling ankle  LIVING ENVIRONMENT: Lives with: lives with their spouse and lives with their daughter Lives in:  1 story home Stairs:  4 steps to enter home without rail Has following equipment at home: Crutches and CAM walker boot  OCCUPATION:   PLOF: Independent  skateboarding and gardening   PATIENT GOALS: return to PLOF  NEXT MD VISIT: 01/21/2024  OBJECTIVE:  Note: Objective measures were completed at Evaluation unless otherwise noted.  DIAGNOSTIC FINDINGS: Pt is post op.  She had x rays and MRI's prior to surgery.  See Epic for MRI's.   PATIENT SURVEYS:  LEFS:  49/80  COGNITION: Overall cognitive status: Within functional limits for tasks assessed          GAIT: Level of assistance: Independent Comments:  Pt reports no increased pain with ambulation without crutch.  TREATMENT:    6/5  Add recumbent bike and eversion PROM vs AROM   6/3 Supine SLR 3x10 with 2# weight above knee S/L hip abduction 3x10 Ankle pumps 3x10 PROM L ankle DF/PF  Towel scrunches x30 Toe yoga x30ea  Bridge with physioball 2x10 Ankle submaximal isometrics x 10 reps with 5 sec hold in PF and DF  L ankle DF AROM:  12 deg  R ankle DF AROM:  20 deg  Pt had one area of redness with a scab at mid incision.  Pt states the shoes may have rubbed it.  PT instructed pt to monitor area and educated pt in proper footwear.   5/29:  PROM L ankle DF/PF  Ankle isometrics PF,DF,eversion Supine SLR 3x10 S/l hip abduction 3x10 Bridge with physioball 2x10 HSC with  physioball 2x10 Ankle DF/PF AROM 3x10 Towel scrunches x30 Toe yoga x30ea  Upright Bike L3   5/27:  Reviewed pt presentation, pain level, HEP compliance, and response to prior Rx.  Incision closed and healing well.  Incision has a normal purple color with no drainage.  Supine SLR 3x10 Toe flex/ext AROM 2x10 Ankle DF/PF AROM 3x10 Towel scrunches Toe yoga 2x10    Pt received gentle DF/PF PROM w/n pt and tissue tolerance.  Updated HEP and gave pt a HEP handout.  PT educated pt in correct form and appropriate frequency.  PT instructed pt in appropriate ROM including to not force PF and not perform into a tight range.  PT instructed pt to not perform DF and PF in a painful range.   PATIENT EDUCATION:  Education details: post op and protocol restrictions and limitations, exercise form, HEP, relevant anatomy, and dx.  PT educated pt in proper footwear.  PT answered pt's questions.   Person educated: Patient Education method: Explanation, Demonstration, Tactile cues, Verbal cues, and Handouts Education comprehension: verbalized understanding, returned demonstration, verbal cues required, tactile cues required, and needs further education  HOME EXERCISE PROGRAM: Access Code: [email or web address] URL: [email or web address] Date: 01/11/2024 Prepared by: Marnie Siren    ASSESSMENT:  CLINICAL IMPRESSION: Pt is making good progress.  She continues to progress well with protocol and ankle ROM per protocol.  PT assessed DF AROM and pt has 12 degrees.  Pt had no pain with DF and PF submaximal isometrics and had good tolerance with all exercises.  Pt has one area of redness with a scab at mid incision which pt thinks is from her shoe rubbing it.  PT instructed pt to monitor area and educated pt in proper footwear.  Pt responded well to Rx reporting no pain and having no c/o's after Rx.  Pt will benefit from cont skilled PT per protocol to address goals and impairments and improve  overall function.    OBJECTIVE IMPAIRMENTS: Abnormal gait, decreased activity tolerance, decreased mobility, difficulty walking, decreased ROM, decreased strength, hypomobility, and pain.   ACTIVITY LIMITATIONS: standing, squatting, sleeping, stairs, transfers, and locomotion level  PARTICIPATION LIMITATIONS: meal prep, cleaning, laundry, driving, shopping, community activity, occupation, and yard work  PERSONAL FACTORS:   REHAB POTENTIAL: Good  CLINICAL DECISION MAKING: Stable/uncomplicated  EVALUATION COMPLEXITY: Low   GOALS:   SHORT TERM GOALS:  Pt will be independent and compliant with HEP for improved ROM, strength, mobility, and function.  Baseline: Goal status: MET 5/29 Target date:  02/15/2024    2.  Pt will progress and tolerate ankle ROM as appropriate per protocol without adverse effects for improved stiffness, ROM, and mobility.  Baseline:  Goal  status: INITIAL Target date:  02/29/2024   3.  Pt will wean out of boot as allowed by MD without adverse effects.  Baseline:  Goal status: MET 5/29 Target date:  6/16 - 03/07/2024  4.  Pt will ambulate with a normalized heel to toe gait without limping. Baseline:  Goal status: INITIAL Target date:  03/28/2024   5.  Pt will demo at least 10 deg of DF AROM for improved gait and stiffness. Baseline:  Goal status: INITIAL Target date:  03/21/2024    LONG TERM GOALS: Target date: 05/02/2024   Pt will demo R ankle AROM to be [facility] t/o for performance of ADLs and IADLs  Baseline:  Goal status: INITIAL Target date:  04/11/2024    2.   Pt will ambulate extended community distance without increased pain and with good stability.  Baseline:  Goal status: INITIAL  3.  Pt will be able to perform her ADL's/IADL's without significant pain and difficulty.  Baseline:  Goal status: INITIAL  4.  Pt will be to ascend and descend stairs with a reciprocal gait with good control with the rail.  Baseline:  Goal status:  INITIAL  5.    Pt will demo 5/5 strength in DF and eversion, WFL in PF in sitting, and 4 to 4+/5 in inversion for improved performance of functional mobility skills and to assist with returning to hobbies and recreational activities. Baseline:  Goal status: INITIAL  6.   Pt will demo 10 symmetrical bilat heel raises without significant pain for improved strength.  Baseline:  Goal status: INITIAL Target date:  04/11/2024    PLAN:  PT FREQUENCY: 1x/4-6 weeks and 2x/wk afterwards  PT DURATION: other: 16 weeks  PLANNED INTERVENTIONS: 97164- PT Re-evaluation, 97750- Physical Performance Testing, 97110-Therapeutic exercises, 97530- Therapeutic activity, [id number]- Neuromuscular re-education, 97535- Self Care, 16109- Manual therapy, [phone]- Gait training, [phone]- Aquatic Therapy, [phone]- Electrical stimulation (unattended), [phone]- Electrical stimulation (manual), 97016- Vasopneumatic device, 97035- Ultrasound, Patient/Family education, Balance training, Stair training, Taping, Dry Needling, Joint mobilization, Scar mobilization, DME instructions, Cryotherapy, and Moist heat  PLAN FOR NEXT SESSION: Cont per surgical protocol and MD orders.     Trina Fujita III PT, DPT 02/18/24 7:35 AM

## 2024-02-25 ENCOUNTER — Ambulatory Visit (INDEPENDENT_AMBULATORY_CARE_PROVIDER_SITE_OTHER): Admitting: Orthopaedic Surgery

## 2024-02-25 DIAGNOSIS — M25372 Other instability, left ankle: Secondary | ICD-10-CM

## 2024-02-25 NOTE — Progress Notes (Signed)
 Post Operative Evaluation    Procedure/Date of Surgery: Left ankle Brostrm repair with internal brace 4/24  Interval History:    Presents 6 weeks status post the above procedure.  Overall doing extremely well.  She has no pain.  Overall she is doing quite well and walking normal distances not   PMH/PSH/Family History/Social History/Meds/Allergies:    Past Medical History:  Diagnosis Date   ADD (attention deficit disorder)    Anxiety    Depression    Fatigue 10/30/2021   Gestational hypertension    Poor motivation 01/30/2023   Recent skin changes 12/15/2022   Tachycardia, unspecified 02/23/2023   Past Surgical History:  Procedure Laterality Date   LAPAROSCOPIC APPENDECTOMY N/A 10/19/2020   Procedure: APPENDECTOMY LAPAROSCOPIC;  Surgeon: Shela Derby, MD;  Location: [facility] OR;  Service: General;  Laterality: N/A;   TONSILLECTOMY  2008   Social History   Socioeconomic History   Marital status: Married    Spouse name: Not on file   Number of children: Not on file   Years of education: Not on file   Highest education level: Not on file  Occupational History   Not on file  Tobacco Use   Smoking status: Never   Smokeless tobacco: Never  Substance and Sexual Activity   Alcohol use: Yes   Drug use: Never   Sexual activity: Yes    Birth control/protection: Pill  Other Topics Concern   Not on file  Social History Narrative   Not on file   Social Drivers of Health   Financial Resource Strain: Low Risk  (12/01/2019)   Received from [facility]   Overall Financial Resource Strain (CARDIA)    Difficulty of Paying Living Expenses: Not hard at all  Food Insecurity: No Food Insecurity (12/01/2019)   Received from [facility]   Hunger Vital Sign    Worried About Running Out of Food in the Last Year: Never true    Ran Out of Food in the Last Year: Never true  Transportation Needs: No Transportation Needs (12/01/2019)   Received  from [facility]   PRAPARE - Transportation    Lack of Transportation (Medical): No    Lack of Transportation (Non-Medical): No  Physical Activity: Insufficiently Active (12/01/2019)   Received from [facility]   Exercise Vital Sign    Days of Exercise per Week: 4 days    Minutes of Exercise per Session: 20 min  Stress: No Stress Concern Present (12/01/2019)   Received from [facility] - Occupational Stress Questionnaire    Feeling of Stress : Not at all  Social Connections: Unknown (12/01/2019)   Received from [facility]   Social Connection and Isolation Panel    Frequency of Communication with Friends and Family: Once a week    Frequency of Social Gatherings with Friends and Family: Once a week    Attends Religious Services: Never    Database administrator or Organizations: No    Attends Banker Meetings: Never    Marital Status: Not on file   No family history on file. No Known Allergies Current Outpatient Medications  Medication Sig Dispense Refill   [START ON 04/08/2024] amphetamine -dextroamphetamine  (ADDERALL) 20 MG tablet Take 1 tablet (20 mg total) by  mouth 2 (two) times daily. 60 tablet 0   amphetamine -dextroamphetamine  (ADDERALL) 20 MG tablet Take 1 tablet (20 mg total) by mouth 2 (two) times daily. 60 tablet 0   [START ON 03/11/2024] amphetamine -dextroamphetamine  (ADDERALL) 20 MG tablet Take 1 tablet (20 mg total) by mouth 2 (two) times daily. 60 tablet 0   aspirin  EC 325 MG tablet Take 1 tablet (325 mg total) by mouth daily. (Patient not taking: Reported on 12/11/2023) 14 tablet 0   budesonide -formoterol  (SYMBICORT ) 80-4.5 MCG/ACT inhaler Inhale 2 puffs into the lungs 2 (two) times daily. 1 each 5   buPROPion  (WELLBUTRIN  XL) 150 MG 24 hr tablet Take 2 tablets (300 mg total) by mouth daily. 180 tablet 3   cholecalciferol (VITAMIN D3) 25 MCG (1000 UNIT) tablet Take 1,000 Units by mouth daily. Pt. Takes 2,000  units dialy     [START ON 03/11/2024] lisdexamfetamine (VYVANSE ) 70 MG capsule Take 1 capsule (70 mg total) by mouth daily. 30 capsule 0   [START ON 04/08/2024] lisdexamfetamine (VYVANSE ) 70 MG capsule Take 1 capsule (70 mg total) by mouth daily. 30 capsule 0   lisdexamfetamine (VYVANSE ) 70 MG capsule Take 1 capsule (70 mg total) by mouth daily. 30 capsule 0   norethindrone  (MICRONOR ) 0.35 MG tablet Take 1 tablet (0.35 mg total) by mouth every morning. 84 tablet 3   omeprazole (PRILOSEC) 20 MG capsule Take 20 mg by mouth daily.     ondansetron  (ZOFRAN ) 8 MG tablet Take 1 tablet (8 mg total) by mouth every 8 (eight) hours as needed for nausea or vomiting. 30 tablet 5   oxyCODONE  (ROXICODONE ) 5 MG immediate release tablet Take 1 tablet (5 mg total) by mouth every 4 (four) hours as needed for severe pain (pain score 7-10) or breakthrough pain. (Patient not taking: Reported on 12/11/2023) 10 tablet 0   No current facility-administered medications for this visit.   No results found.  Review of Systems:   A ROS was performed including pertinent positives and negatives as documented in the HPI.   Musculoskeletal Exam:    There were no vitals taken for this visit.  Left ankle incisions well-appearing without erythema or drainage.  Negative anterior drawer.  Distal neurosensory exam is intact.  Mild to no swelling  Imaging:      I personally reviewed and interpreted the radiographs.   Assessment:   6 weeks status post left ankle Brostrm repair overall doing extremely well.  She will return to all activity at this time.  I will plan to see her back as needed  Plan :    - Return to clinic as needed      I personally saw and evaluated the patient, and participated in the management and treatment plan.  Wilhelmenia Harada, MD Attending Physician, Orthopedic Surgery  This document was dictated using Dragon voice recognition software. A reasonable attempt at proof reading has been made to  minimize errors.

## 2024-03-14 ENCOUNTER — Other Ambulatory Visit (HOSPITAL_BASED_OUTPATIENT_CLINIC_OR_DEPARTMENT_OTHER): Payer: Self-pay

## 2024-03-14 ENCOUNTER — Other Ambulatory Visit: Payer: Self-pay

## 2024-03-17 ENCOUNTER — Ambulatory Visit: Admitting: Nurse Practitioner

## 2024-03-29 ENCOUNTER — Other Ambulatory Visit (HOSPITAL_BASED_OUTPATIENT_CLINIC_OR_DEPARTMENT_OTHER): Payer: Self-pay

## 2024-03-29 MED ORDER — BUDESONIDE-FORMOTEROL FUMARATE 80-4.5 MCG/ACT IN AERO: 2.0000 | INHALATION_SPRAY | Freq: Two times a day (BID) | RESPIRATORY_TRACT | 1 refills | Status: DC

## 2024-04-11 ENCOUNTER — Other Ambulatory Visit: Payer: Self-pay | Admitting: Nurse Practitioner

## 2024-04-11 ENCOUNTER — Other Ambulatory Visit (HOSPITAL_BASED_OUTPATIENT_CLINIC_OR_DEPARTMENT_OTHER): Payer: Self-pay

## 2024-04-11 DIAGNOSIS — F900 Attention-deficit hyperactivity disorder, predominantly inattentive type: Secondary | ICD-10-CM

## 2024-04-12 ENCOUNTER — Other Ambulatory Visit (HOSPITAL_BASED_OUTPATIENT_CLINIC_OR_DEPARTMENT_OTHER): Payer: Self-pay

## 2024-04-12 ENCOUNTER — Other Ambulatory Visit: Payer: Self-pay

## 2024-04-12 MED ORDER — LISDEXAMFETAMINE DIMESYLATE 70 MG PO CAPS
70.0000 mg | ORAL_CAPSULE | Freq: Every day | ORAL | 0 refills | Status: DC
Start: 2024-04-12 — End: 2024-05-12
  Filled 2024-04-12: qty 30, 30d supply, fill #0

## 2024-04-12 NOTE — Telephone Encounter (Signed)
 Last apt 12/11/23.

## 2024-04-28 ENCOUNTER — Ambulatory Visit: Admitting: Nurse Practitioner

## 2024-05-12 ENCOUNTER — Other Ambulatory Visit: Payer: Self-pay | Admitting: Nurse Practitioner

## 2024-05-12 DIAGNOSIS — F900 Attention-deficit hyperactivity disorder, predominantly inattentive type: Secondary | ICD-10-CM

## 2024-05-13 ENCOUNTER — Other Ambulatory Visit: Payer: Self-pay

## 2024-05-13 ENCOUNTER — Other Ambulatory Visit (HOSPITAL_BASED_OUTPATIENT_CLINIC_OR_DEPARTMENT_OTHER): Payer: Self-pay

## 2024-05-13 MED ORDER — LISDEXAMFETAMINE DIMESYLATE 70 MG PO CAPS: 70.0000 mg | ORAL_CAPSULE | Freq: Every day | ORAL | 0 refills | Status: DC

## 2024-05-13 MED ORDER — LISDEXAMFETAMINE DIMESYLATE 70 MG PO CAPS
70.0000 mg | ORAL_CAPSULE | Freq: Every day | ORAL | 0 refills | Status: DC
  Filled 2024-05-13: qty 30, 30d supply, fill #0

## 2024-05-13 MED ORDER — AMPHETAMINE-DEXTROAMPHETAMINE 20 MG PO TABS
20.0000 mg | ORAL_TABLET | Freq: Two times a day (BID) | ORAL | 0 refills | Status: DC
  Filled 2024-07-11: qty 60, 30d supply, fill #0

## 2024-05-13 MED ORDER — LISDEXAMFETAMINE DIMESYLATE 70 MG PO CAPS
70.0000 mg | ORAL_CAPSULE | Freq: Every day | ORAL | 0 refills | Status: DC
  Filled 2024-07-11: qty 30, 30d supply, fill #0

## 2024-05-13 MED ORDER — AMPHETAMINE-DEXTROAMPHETAMINE 20 MG PO TABS: 20.0000 mg | ORAL_TABLET | Freq: Two times a day (BID) | ORAL | 0 refills | Status: DC

## 2024-05-13 MED ORDER — AMPHETAMINE-DEXTROAMPHETAMINE 20 MG PO TABS
20.0000 mg | ORAL_TABLET | Freq: Two times a day (BID) | ORAL | 0 refills | Status: DC
  Filled 2024-05-13: qty 60, 30d supply, fill #0

## 2024-05-13 NOTE — Telephone Encounter (Signed)
 Last apt 12/11/23.

## 2024-05-18 ENCOUNTER — Ambulatory Visit: Admitting: Nurse Practitioner

## 2024-05-30 ENCOUNTER — Other Ambulatory Visit (HOSPITAL_BASED_OUTPATIENT_CLINIC_OR_DEPARTMENT_OTHER): Payer: Self-pay | Admitting: Pulmonary Disease

## 2024-05-30 ENCOUNTER — Ambulatory Visit: Admitting: Nurse Practitioner

## 2024-05-30 VITALS — BP 132/82 | HR 122 | Wt 159.8 lb

## 2024-05-30 DIAGNOSIS — R251 Tremor, unspecified: Secondary | ICD-10-CM

## 2024-05-30 DIAGNOSIS — D696 Thrombocytopenia, unspecified: Secondary | ICD-10-CM | POA: Diagnosis not present

## 2024-05-30 DIAGNOSIS — Z23 Encounter for immunization: Secondary | ICD-10-CM

## 2024-05-30 DIAGNOSIS — T452X5A Adverse effect of vitamins, initial encounter: Secondary | ICD-10-CM | POA: Diagnosis not present

## 2024-05-30 DIAGNOSIS — E539 Vitamin B deficiency, unspecified: Secondary | ICD-10-CM | POA: Diagnosis not present

## 2024-05-30 NOTE — Progress Notes (Signed)
 Camie FORBES Doing, DNP, AGNP-c [facility] [address] [place], [place] [address] [phone]   ACUTE VISIT on 05/30/2024  Blood pressure 132/82, pulse (!) 122, weight 159 lb 12.8 oz (72.5 kg).  Subjective:  HPI History of Present Illness Jessica Graham is a 38 year old female who presents with worsening tremors and shakiness.  She has experienced tremors primarily in her hands, which have now progressed to her upper arms and jaw over the past few months. The tremors are bilateral and particularly noticeable when writing or drawing, although at rest these can also be visualized. The patient reports she has always been able to steady her hands but now finds it harder to do so. There have been no changes in her stimulant medication regimen, which includes Vyvanse  and Adderall for approximately nine years.  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 No numbness or tingling, except for some numbness in her leg post-surgery due to a nerve block. She reports feeling tired all the time and notes muscle weakness, though she is unsure if this is related to decreased physical activity.  She has a history of being 'hot all the time' and experiencing night sweats while on Zoloft, which she discontinued due to these side effects.  She has been on Symbicort  for at least a year.  She tends to get sick more severely and for longer durations than her family, with a recent illness leaving her coughing for two weeks.  She has a history of a hard fall while skateboarding, where she hit the back of her head on concrete while wearing a helmet, but she did not lose consciousness or experience any vomiting.   She has not noticed any tremors in her voice and does not report any specific triggers or alleviating factors for her symptoms.  ROS negative except  for what is listed in HPI. History, Medications, Surgery, SDOH, and Family History reviewed and updated as appropriate.  Objective:  Physical Exam Vitals and nursing note reviewed.  Constitutional:      General: She is not in acute distress.    Appearance: Normal appearance. She is not ill-appearing.  HENT:     Head: Normocephalic and atraumatic.  Eyes:     Extraocular Movements: Extraocular movements intact.     Conjunctiva/sclera: Conjunctivae normal.     Pupils: Pupils are equal, round, and reactive to light.  Cardiovascular:     Rate and Rhythm: Regular rhythm. Tachycardia present.     Pulses: Normal pulses.     Heart sounds: Normal heart sounds.  Pulmonary:     Effort: Pulmonary effort is normal.     Breath sounds: Normal breath sounds.  Musculoskeletal:        General: Normal range of motion.     Right lower leg: No edema.     Left lower leg: No edema.  Skin:    General: Skin is warm and dry.     Capillary Refill: Capillary refill takes less than 2 seconds.  Neurological:     Mental Status: She is alert and oriented to person, place, and time.     Cranial Nerves: Cranial nerves 2-12 are intact. No dysarthria or facial asymmetry.     Sensory: Sensation is intact.     Motor: Tremor present. No weakness, atrophy or abnormal muscle tone.     Coordination: Coordination normal. Finger-Nose-Finger  Test normal.     Gait: Gait normal.     Deep Tendon Reflexes: Reflexes normal.  Psychiatric:        Mood and Affect: Mood normal.         Assessment & Plan:   Problem List Items Addressed This Visit     Tremors of nervous system - Primary   Chronic tremor with worsening symptoms over the past few months, initially in hands, now involving upper arms and jaw. No recent changes in stimulant medication (Vyvanse  and Adderall) used for nine years. No significant weight loss or other changes to explain worsening tremor. Differential diagnosis includes potential neurological causes,  thyroid dysfunction, or other systemic issues. No loss of consciousness or significant head injury despite a recent fall- symptoms were present prior to the fall. No significant changes in gait or coordination. No clear triggers identified for tremor exacerbation. Her strength is intact. Course tremors are clearly present in the upper extremities with action and at rest. Jaw tremors with a shivering type movement witnessed. We discussed that this could be related to her stimulant medication, but it is possible this is unrelated given that she has been on this for such a long period of time without dose changes and with the symptoms very mild until just recently.  - Order laboratory tests to evaluate thyroid function and other potential systemic causes. - Refer to neurology for further evaluation and recommendations.      Relevant Orders   Vitamin B12   T4, free   TSH   VITAMIN D  25 Hydroxy (Vit-D Deficiency, Fractures)   CBC with Differential/Platelet   Comprehensive metabolic panel with GFR   Ambulatory referral to Neurology   Other Visit Diagnoses       Need for influenza vaccination       Relevant Orders   Flu vaccine trivalent PF, 6mos and older(Flulaval ,Afluria,Fluarix,Fluzone) (Completed)       Camie FORBES Doing, DNP, AGNP-c Time: 44 minutes, >50% spent counseling, care coordination, chart review, and documentation.

## 2024-05-30 NOTE — Patient Instructions (Addendum)
 I want to get a few labs to rule out common causes, but I also want to send a referral to neurology to get that started just in case the results are all normal.   Try to keep a journal of things that seem to trigger this to be worse or times that you feel like it may be better.      Compensation Strategies for Tremors  When eating, try the following Eat out of bowls, divided plates, or use a plate guard (available at a medical supply store) and eat with a spoon so that you have an edge to scoop up food. Try raising your plate so that there is less distance between the plate and mouth.Try stabilizing elbows on the tables or against your body. Use utensil with built-up/larger grips as they are easier to hold.  When writing, try the following: Stabilize forearm on the table. Take your time as rushing/being stressed can increase tremors. Try a felt-tipped pen, it does not glide as much.  Avoid gel pens ( they move to much ). Consider using pre-printed labels with your name and address (carry them with you when you go out) or you can get stamps with your address or signature on it. Use a small tape recorder to record messages/reminders for yourself. Use pens with bigger grips.  When brushing your teeth, putting on make-up, or styling hair, try the following: Use an electric toothbrush. Use items with built-up grips. Stabilize your elbows against your body or on the counter. Use long-handled brushes/combs. Use a hair dryer with a stand.  In general: Avoid stress, fatigue or rushing as this can increase tremors. Sit down for activities that require more control/coordination. Perform flicks.

## 2024-05-30 NOTE — Assessment & Plan Note (Signed)
 Chronic tremor with worsening symptoms over the past few months, initially in hands, now involving upper arms and jaw. No recent changes in stimulant medication (Vyvanse  and Adderall) used for nine years. No significant weight loss or other changes to explain worsening tremor. Differential diagnosis includes potential neurological causes, thyroid dysfunction, or other systemic issues. No loss of consciousness or significant head injury despite a recent fall- symptoms were present prior to the fall. No significant changes in gait or coordination. No clear triggers identified for tremor exacerbation. Her strength is intact. Course tremors are clearly present in the upper extremities with action and at rest. Jaw tremors with a shivering type movement witnessed. We discussed that this could be related to her stimulant medication, but it is possible this is unrelated given that she has been on this for such a long period of time without dose changes and with the symptoms very mild until just recently.  - Order laboratory tests to evaluate thyroid function and other potential systemic causes. - Refer to neurology for further evaluation and recommendations.

## 2024-05-31 LAB — CBC WITH DIFFERENTIAL/PLATELET
Basophils Absolute: 0.1 x10E3/uL (ref 0.0–0.2)
Basos: 1 %
EOS (ABSOLUTE): 0.2 x10E3/uL (ref 0.0–0.4)
Eos: 2 %
Hematocrit: 41 % (ref 34.0–46.6)
Hemoglobin: 13.5 g/dL (ref 11.1–15.9)
Immature Grans (Abs): 0 x10E3/uL (ref 0.0–0.1)
Immature Granulocytes: 0 %
Lymphocytes Absolute: 2.5 x10E3/uL (ref 0.7–3.1)
Lymphs: 29 %
MCH: 30.9 pg (ref 26.6–33.0)
MCHC: 32.9 g/dL (ref 31.5–35.7)
MCV: 94 fL (ref 79–97)
Monocytes Absolute: 0.5 x10E3/uL (ref 0.1–0.9)
Monocytes: 6 %
Neutrophils Absolute: 5.4 x10E3/uL (ref 1.4–7.0)
Neutrophils: 62 %
Platelets: 453 x10E3/uL — ABNORMAL HIGH (ref 150–450)
RBC: 4.37 x10E6/uL (ref 3.77–5.28)
RDW: 11.8 % (ref 11.7–15.4)
WBC: 8.7 x10E3/uL (ref 3.4–10.8)

## 2024-05-31 LAB — COMPREHENSIVE METABOLIC PANEL WITH GFR
ALT: 21 IU/L (ref 0–32)
AST: 22 IU/L (ref 0–40)
Albumin: 4.7 g/dL (ref 3.9–4.9)
Alkaline Phosphatase: 98 IU/L (ref 41–116)
BUN/Creatinine Ratio: 14 (ref 9–23)
BUN: 11 mg/dL (ref 6–20)
Bilirubin Total: 0.2 mg/dL (ref 0.0–1.2)
CO2: 20 mmol/L (ref 20–29)
Calcium: 10.1 mg/dL (ref 8.7–10.2)
Chloride: 103 mmol/L (ref 96–106)
Creatinine, Ser: 0.8 mg/dL (ref 0.57–1.00)
Globulin, Total: 2.8 g/dL (ref 1.5–4.5)
Glucose: 108 mg/dL — AB (ref 70–99)
Potassium: 4.5 mmol/L (ref 3.5–5.2)
Sodium: 137 mmol/L (ref 134–144)
Total Protein: 7.5 g/dL (ref 6.0–8.5)
eGFR: 98 mL/min/1.73 (ref >59)

## 2024-05-31 LAB — TSH: TSH: 1.56 u[IU]/mL (ref 0.450–4.500)

## 2024-05-31 LAB — VITAMIN D 25 HYDROXY (VIT D DEFICIENCY, FRACTURES): Vit D, 25-Hydroxy: 45.1 ng/mL (ref 30.0–100.0)

## 2024-05-31 LAB — VITAMIN B12: Vitamin B-12: 1523 pg/mL — AB (ref 232–1245)

## 2024-05-31 LAB — T4, FREE: Free T4: 1.1 ng/dL (ref 0.82–1.77)

## 2024-06-01 ENCOUNTER — Ambulatory Visit: Payer: Self-pay | Admitting: Nurse Practitioner

## 2024-06-01 DIAGNOSIS — R7989 Other specified abnormal findings of blood chemistry: Secondary | ICD-10-CM

## 2024-06-03 LAB — STATUS REPORT

## 2024-06-06 LAB — JAK2 V617F, REFLEX TO CALR/MPL

## 2024-06-09 LAB — JAK2 V617F, REFLEX TO CALR/MPL

## 2024-06-09 LAB — CALR + MPL

## 2024-06-09 LAB — METHYLMALONIC ACID, SERUM: Methylmalonic Acid: 170 nmol/L (ref 0–378)

## 2024-06-09 LAB — SPECIMEN STATUS REPORT

## 2024-06-11 ENCOUNTER — Encounter: Payer: Self-pay | Admitting: Nurse Practitioner

## 2024-06-13 ENCOUNTER — Other Ambulatory Visit: Payer: Self-pay | Admitting: Nurse Practitioner

## 2024-06-13 DIAGNOSIS — F900 Attention-deficit hyperactivity disorder, predominantly inattentive type: Secondary | ICD-10-CM

## 2024-06-13 MED ORDER — AMPHETAMINE-DEXTROAMPHETAMINE 20 MG PO TABS: 20.0000 mg | ORAL_TABLET | Freq: Two times a day (BID) | ORAL | 0 refills | Status: DC

## 2024-06-13 MED ORDER — LISDEXAMFETAMINE DIMESYLATE 70 MG PO CAPS: 70.0000 mg | ORAL_CAPSULE | Freq: Every day | ORAL | 0 refills | Status: DC

## 2024-06-26 ENCOUNTER — Other Ambulatory Visit: Payer: Self-pay | Admitting: Nurse Practitioner

## 2024-06-26 DIAGNOSIS — F339 Major depressive disorder, recurrent, unspecified: Secondary | ICD-10-CM

## 2024-06-27 ENCOUNTER — Other Ambulatory Visit (HOSPITAL_BASED_OUTPATIENT_CLINIC_OR_DEPARTMENT_OTHER): Payer: Self-pay

## 2024-06-27 MED ORDER — BUPROPION HCL ER (XL) 150 MG PO TB24
300.0000 mg | ORAL_TABLET | Freq: Every day | ORAL | 1 refills | Status: AC
  Filled 2024-06-27: qty 180, 90d supply, fill #0
  Filled 2024-09-25: qty 180, 90d supply, fill #1

## 2024-06-28 ENCOUNTER — Other Ambulatory Visit (HOSPITAL_BASED_OUTPATIENT_CLINIC_OR_DEPARTMENT_OTHER): Payer: Self-pay

## 2024-07-11 ENCOUNTER — Other Ambulatory Visit (HOSPITAL_BASED_OUTPATIENT_CLINIC_OR_DEPARTMENT_OTHER): Payer: Self-pay

## 2024-07-11 ENCOUNTER — Other Ambulatory Visit: Payer: Self-pay

## 2024-07-18 ENCOUNTER — Encounter: Payer: Self-pay | Admitting: Radiology

## 2024-07-28 ENCOUNTER — Other Ambulatory Visit (HOSPITAL_BASED_OUTPATIENT_CLINIC_OR_DEPARTMENT_OTHER): Payer: Self-pay | Admitting: Pulmonary Disease

## 2024-07-28 ENCOUNTER — Ambulatory Visit: Admitting: Nurse Practitioner

## 2024-08-02 ENCOUNTER — Ambulatory Visit: Admitting: Nurse Practitioner

## 2024-08-02 ENCOUNTER — Ambulatory Visit
Admission: RE | Admit: 2024-08-02 | Discharge: 2024-08-02 | Disposition: A | Discharge status: Home / Self Care | Source: Ambulatory Visit | Attending: Nurse Practitioner | Admitting: Nurse Practitioner

## 2024-08-02 ENCOUNTER — Other Ambulatory Visit (HOSPITAL_BASED_OUTPATIENT_CLINIC_OR_DEPARTMENT_OTHER): Payer: Self-pay

## 2024-08-02 ENCOUNTER — Encounter: Payer: Self-pay | Admitting: Nurse Practitioner

## 2024-08-02 VITALS — BP 122/82 | HR 114 | Wt 160.4 lb

## 2024-08-02 DIAGNOSIS — H6690 Otitis media, unspecified, unspecified ear: Secondary | ICD-10-CM

## 2024-08-02 DIAGNOSIS — F900 Attention-deficit hyperactivity disorder, predominantly inattentive type: Secondary | ICD-10-CM | POA: Diagnosis not present

## 2024-08-02 DIAGNOSIS — J4521 Mild intermittent asthma with (acute) exacerbation: Secondary | ICD-10-CM

## 2024-08-02 DIAGNOSIS — R052 Subacute cough: Secondary | ICD-10-CM

## 2024-08-02 DIAGNOSIS — R7989 Other specified abnormal findings of blood chemistry: Secondary | ICD-10-CM | POA: Diagnosis not present

## 2024-08-02 MED ORDER — LISDEXAMFETAMINE DIMESYLATE 70 MG PO CAPS: 70.0000 mg | ORAL_CAPSULE | Freq: Every day | ORAL | 0 refills | Status: DC

## 2024-08-02 MED ORDER — PROMETHAZINE-DM 6.25-15 MG/5ML PO SYRP
5.0000 mL | ORAL_SOLUTION | Freq: Four times a day (QID) | ORAL | 0 refills | Status: AC | PRN
  Filled 2024-08-02: qty 118, 6d supply, fill #0

## 2024-08-02 MED ORDER — AMPHETAMINE-DEXTROAMPHETAMINE 20 MG PO TABS
20.0000 mg | ORAL_TABLET | Freq: Two times a day (BID) | ORAL | 0 refills | Status: DC
  Filled 2024-08-02 – 2024-08-08 (×2): qty 60, 30d supply, fill #0

## 2024-08-02 MED ORDER — FLUCONAZOLE 150 MG PO TABS
ORAL_TABLET | ORAL | 3 refills | Status: AC
  Filled 2024-08-02: qty 2, 4d supply, fill #0
  Filled 2024-09-18: qty 2, 4d supply, fill #1

## 2024-08-02 MED ORDER — AMOXICILLIN-POT CLAVULANATE 875-125 MG PO TABS
1.0000 | ORAL_TABLET | Freq: Two times a day (BID) | ORAL | 0 refills | Status: DC
  Filled 2024-08-02: qty 14, 7d supply, fill #0

## 2024-08-02 MED ORDER — AMPHETAMINE-DEXTROAMPHETAMINE 20 MG PO TABS: 20.0000 mg | ORAL_TABLET | Freq: Two times a day (BID) | ORAL | 0 refills | Status: DC

## 2024-08-02 MED ORDER — PREDNISONE 20 MG PO TABS
40.0000 mg | ORAL_TABLET | Freq: Every day | ORAL | 0 refills | Status: DC
  Filled 2024-08-02: qty 10, 5d supply, fill #0

## 2024-08-02 MED ORDER — BENZONATATE 200 MG PO CAPS
200.0000 mg | ORAL_CAPSULE | Freq: Two times a day (BID) | ORAL | 0 refills | Status: DC | PRN
  Filled 2024-08-02: qty 20, 10d supply, fill #0

## 2024-08-02 MED ORDER — LISDEXAMFETAMINE DIMESYLATE 70 MG PO CAPS
70.0000 mg | ORAL_CAPSULE | Freq: Every day | ORAL | 0 refills | Status: DC
  Filled 2024-08-02 – 2024-08-08 (×2): qty 30, 30d supply, fill #0

## 2024-08-02 NOTE — Patient Instructions (Signed)
 [facility]  [address] [employer] in to have the x-ray completed.

## 2024-08-02 NOTE — Assessment & Plan Note (Signed)
 Continue with your current inhalers.

## 2024-08-02 NOTE — Assessment & Plan Note (Signed)
 Well controlled on current regimen. Refills provided. PDMP reviewed with no concerns.

## 2024-08-02 NOTE — Assessment & Plan Note (Signed)
 Subacute cough persisting for weeks, likely due to community-acquired pneumonia.Rhonchus present in the middle and bottom of both lungs with expiratory wheezing noted. No fever or chills. Cough is sometimes productive with colored mucus. Differential includes viral infection and bacterial secondary infection. Consideration of walking pneumonia based on lung sounds. Will obtain CXR and start antibiotics today. Discussed potential side effects of prednisone , including increased energy and possible tremors. She prefers to proceed with treatment. - Ordered chest x-ray at [facility] in [place]. - Prescribed antibiotic for suspected bacterial infection. - Prescribed a 5-day course of prednisone  to reduce inflammation. - Prescribed Tessalon Perles for cough management. - Discussed potential use of cough syrup with codeine or promethazine  for nighttime relief. - Advised to monitor for yeast infection symptoms and contact if she occurs.

## 2024-08-02 NOTE — Assessment & Plan Note (Addendum)
 Muffled hearing in the left ear following removal of an earbud. Opaque fluid behind the eardrum, possibly indicating the end of an infection or the start of a new one. Right ear has a tiny amount of clear fluid. Symptoms suggest possible bacterial involvement. Antibiotic therapy ordered for both ears and lungs.

## 2024-08-02 NOTE — Progress Notes (Signed)
 Camie FORBES Doing, DNP, AGNP-c [facility] [address] [place], [place] [address] [phone]   ACUTE VISIT on 08/02/2024  Blood pressure 122/82, pulse (!) 114, weight 160 lb 6.4 oz (72.8 kg).  Subjective:  HPI  History of Present Illness Jessica Graham is a 38 year old female with subacute cough and reflux who presents with persistent respiratory symptoms and ear discomfort.  She has been experiencing persistent respiratory symptoms, including a chronic cough lasting for weeks to months. Despite using an inhaler and Singulair , the cough persists, worsening at night and during extended conversations. Mucus production is noted, sometimes colored. No fever or chills are present, but she feels 'sick and tired' for an extended period.  She has a history of reflux and takes omeprazole daily, which helps manage her symptoms. However, the current cough feels different from previous reflux-related coughs.  She reports ear discomfort after an earbud was lodged deeply in her ear, which her husband removed. Although the pain subsided after removal, she continues to experience muffled hearing. Her ear initially felt less muffled but she reports it worsened again recently.  She has been experiencing poor sleep quality, exacerbated by her dogs needing attention at night. Her sleep tracker has shown poor sleep quality for over a month.  In terms of social history, she mentions dealing with various viruses, likely contracted from her daughter, for about two months. Her husband experienced a similar virus but recovered more quickly.  ROS negative except for what is listed in HPI. History, Medications, Surgery, SDOH, and Family History reviewed and updated as appropriate.  Objective:  Physical Exam Vitals and nursing note reviewed.  Constitutional:      General: She is not in acute distress. HENT:     Head: Normocephalic.     Right Ear: Tympanic membrane normal.     Left Ear: Ear  canal and external ear normal. Tenderness present. A middle ear effusion is present. Tympanic membrane is bulging.     Nose: Rhinorrhea present.     Mouth/Throat:     Mouth: Mucous membranes are moist.     Pharynx: Oropharynx is clear. Posterior oropharyngeal erythema present.  Eyes:     Extraocular Movements: Extraocular movements intact.  Cardiovascular:     Rate and Rhythm: Regular rhythm. Tachycardia present.     Pulses: Normal pulses.     Heart sounds: Normal heart sounds.  Pulmonary:     Effort: Pulmonary effort is normal.     Breath sounds: Wheezing and rhonchi present.  Lymphadenopathy:     Cervical: Cervical adenopathy present.  Skin:    General: Skin is warm and dry.     Capillary Refill: Capillary refill takes less than 2 seconds.  Neurological:     Mental Status: She is alert and oriented to person, place, and time.  Psychiatric:        Mood and Affect: Mood normal.         Assessment & Plan:   Problem List Items Addressed This Visit     Attention deficit hyperactivity disorder, predominantly inattentive type   Well controlled on current regimen. Refills provided. PDMP reviewed with no concerns.       Relevant Medications   amphetamine -dextroamphetamine  (ADDERALL) 20 MG tablet (Start on 08/30/2024)   amphetamine -dextroamphetamine  (ADDERALL) 20 MG tablet (Start on 09/27/2024)   amphetamine -dextroamphetamine  (ADDERALL) 20 MG tablet   lisdexamfetamine (VYVANSE ) 70 MG capsule   lisdexamfetamine (VYVANSE ) 70 MG capsule (Start on 08/30/2024)   lisdexamfetamine (VYVANSE ) 70 MG capsule (Start  on 09/27/2024)   Asthmatic bronchitis   Continue with your current inhalers.      Relevant Medications   montelukast  (SINGULAIR ) 10 MG tablet   predniSONE  (DELTASONE ) 20 MG tablet   Subacute cough - Primary   Subacute cough persisting for weeks, likely due to community-acquired pneumonia.Rhonchus present in the middle and bottom of both lungs with expiratory wheezing noted. No  fever or chills. Cough is sometimes productive with colored mucus. Differential includes viral infection and bacterial secondary infection. Consideration of walking pneumonia based on lung sounds. Will obtain CXR and start antibiotics today. Discussed potential side effects of prednisone , including increased energy and possible tremors. She prefers to proceed with treatment. - Ordered chest x-ray at [facility] in [place]. - Prescribed antibiotic for suspected bacterial infection. - Prescribed a 5-day course of prednisone  to reduce inflammation. - Prescribed Tessalon Perles for cough management. - Discussed potential use of cough syrup with codeine or promethazine  for nighttime relief. - Advised to monitor for yeast infection symptoms and contact if she occurs.      Relevant Medications   predniSONE  (DELTASONE ) 20 MG tablet   fluconazole (DIFLUCAN) 150 MG tablet   benzonatate (TESSALON) 200 MG capsule   promethazine -dextromethorphan (PROMETHAZINE -DM) 6.25-15 MG/5ML syrup   amoxicillin-clavulanate (AUGMENTIN) 875-125 MG tablet   Other Relevant Orders   DG Chest 2 View   Subacute otitis media   Muffled hearing in the left ear following removal of an earbud. Opaque fluid behind the eardrum, possibly indicating the end of an infection or the start of a new one. Right ear has a tiny amount of clear fluid. Symptoms suggest possible bacterial involvement. Antibiotic therapy ordered for both ears and lungs.       Relevant Medications   predniSONE  (DELTASONE ) 20 MG tablet   fluconazole (DIFLUCAN) 150 MG tablet   amoxicillin-clavulanate (AUGMENTIN) 875-125 MG tablet   Other Visit Diagnoses       Abnormal platelet count       Relevant Orders   CBC with Differential/Platelet   Vitamin B12   Iron, TIBC and Ferritin Panel        Camie FORBES Doing, DNP, AGNP-c Time: 30 minutes, >50% spent counseling, care coordination, chart review, and documentation.

## 2024-08-03 ENCOUNTER — Ambulatory Visit: Payer: Self-pay | Admitting: Nurse Practitioner

## 2024-08-03 LAB — CBC WITH DIFFERENTIAL/PLATELET
Basophils Absolute: 0 x10E3/uL (ref 0.0–0.2)
Basos: 1 %
EOS (ABSOLUTE): 0.1 x10E3/uL (ref 0.0–0.4)
Eos: 2 %
Hematocrit: 37.1 % (ref 34.0–46.6)
Hemoglobin: 11.9 g/dL (ref 11.1–15.9)
Immature Grans (Abs): 0 x10E3/uL (ref 0.0–0.1)
Immature Granulocytes: 0 %
Lymphocytes Absolute: 2.6 x10E3/uL (ref 0.7–3.1)
Lymphs: 30 %
MCH: 30.5 pg (ref 26.6–33.0)
MCHC: 32.1 g/dL (ref 31.5–35.7)
MCV: 95 fL (ref 79–97)
Monocytes Absolute: 0.5 x10E3/uL (ref 0.1–0.9)
Monocytes: 6 %
Neutrophils Absolute: 5.5 x10E3/uL (ref 1.4–7.0)
Neutrophils: 61 %
Platelets: 424 x10E3/uL (ref 150–450)
RBC: 3.9 x10E6/uL (ref 3.77–5.28)
RDW: 12.1 % (ref 11.7–15.4)
WBC: 8.8 x10E3/uL (ref 3.4–10.8)

## 2024-08-03 LAB — VITAMIN B12: Vitamin B-12: 858 pg/mL (ref 232–1245)

## 2024-08-03 LAB — IRON,TIBC AND FERRITIN PANEL
Ferritin: 29 ng/mL (ref 15–150)
Iron Saturation: 15 % (ref 15–55)
Iron: 56 ug/dL (ref 27–159)
Total Iron Binding Capacity: 371 ug/dL (ref 250–450)
UIBC: 315 ug/dL (ref 131–425)

## 2024-08-08 ENCOUNTER — Other Ambulatory Visit (HOSPITAL_BASED_OUTPATIENT_CLINIC_OR_DEPARTMENT_OTHER): Payer: Self-pay

## 2024-08-12 ENCOUNTER — Encounter: Payer: Self-pay | Admitting: Nurse Practitioner

## 2024-08-12 ENCOUNTER — Other Ambulatory Visit (HOSPITAL_BASED_OUTPATIENT_CLINIC_OR_DEPARTMENT_OTHER): Payer: Self-pay

## 2024-08-15 ENCOUNTER — Ambulatory Visit: Payer: Self-pay

## 2024-08-15 NOTE — Telephone Encounter (Signed)
 FYI Only or Action Required?: FYI only for provider: appointment scheduled on 08/16/24.  Patient was last seen in primary care on 08/02/2024 by Early, Camie BRAVO, NP.  Called Nurse Triage reporting Cough.  Symptoms began several months ago.  Interventions attempted: OTC medications: Robitussin and Prescription medications: abx, inhalers.  Symptoms are: gradually worsening.  Triage Disposition: See HCP Within 4 Hours (Or PCP Triage)  Patient/caregiver understands and will follow disposition?: Yes   Copied from CRM #[id number]. Topic: Clinical - Red Word Triage >> Aug 15, 2024  4:07 PM Thersia BROCKS wrote: [facility] that prompted transfer to Nurse Triage: Patient called in stated she needs to schedule a appointment for worsening cough, stated she has finished her medication and isnt getting better was up all night cough chest is sore from coughing so much Reason for Disposition  Wheezing is present  Answer Assessment - Initial Assessment Questions Pt called in with worsening cough. Stated her 36 year old had walking pneumonia several months ago that has since cleared but her cough has remained. She has a albuterol  inhaler and other maintenance inhaler but felt they weren't effective. Her FIL is a physician and sent her in medication but cough still remains so she is taking Robitussin. She denies any SOB, chest pain, fever. Appointment scheduled for evaluation. Patient agrees with plan of care, and will call back if anything changes, or if symptoms worsen.     1. ONSET: When did the cough begin?      2 months ago, has worsened over past 3 weeks   2. SEVERITY: How bad is the cough today?      Worse at night; pt's husband is a nephrologist and stated she is starting to develop audible wheezes   3. SPUTUM: Describe the color of your sputum (e.g., none, dry cough; clear, white, yellow, green)     Yellow when able to clear  4. HEMOPTYSIS: Are you coughing up any blood? If Yes, ask: How  much? (e.g., flecks, streaks, tablespoons, etc.)     None  5. DIFFICULTY BREATHING: Are you having difficulty breathing? If Yes, ask: How bad is it? (e.g., mild, moderate, severe)      None  6. FEVER: Do you have a fever? If Yes, ask: What is your temperature, how was it measured, and when did it start?     None  10. OTHER SYMPTOMS: Do you have any other symptoms? (e.g., runny nose, wheezing, chest pain)       Wheezing; minimally productive cough  Protocols used: Cough - Acute Productive-A-AH

## 2024-08-16 ENCOUNTER — Ambulatory Visit: Admitting: Family Medicine

## 2024-08-16 VITALS — BP 124/80 | HR 100 | Wt 155.8 lb

## 2024-08-16 DIAGNOSIS — R0982 Postnasal drip: Secondary | ICD-10-CM

## 2024-08-16 DIAGNOSIS — J3089 Other allergic rhinitis: Secondary | ICD-10-CM

## 2024-08-16 MED ORDER — AZELASTINE HCL 0.1 % NA SOLN: 1.0000 | Freq: Two times a day (BID) | NASAL | 12 refills | Status: AC

## 2024-08-16 MED ORDER — LORATADINE 10 MG PO TABS: 10.0000 mg | ORAL_TABLET | Freq: Every day | ORAL | 11 refills | Status: AC

## 2024-08-16 MED ORDER — FLUTICASONE PROPIONATE 50 MCG/ACT NA SUSP: 2.0000 | Freq: Every day | NASAL | 6 refills | Status: AC

## 2024-08-16 NOTE — Progress Notes (Signed)
   Name: Jessica Graham   Date of Visit: 08/16/24   Date of last visit with me: Visit date not found   CHIEF COMPLAINT:  Chief Complaint  Patient presents with   other    Cough, started about 2 months ago and has changed over the last 3 weeks, 1 round of anti biotic and prednisone  already, coughing stuff up keeping her up at night, using albuterol  and has chest heaviness, harder to breathe than normal and gets winded easily, fluid lt. Ear,        HPI:  Discussed the use of AI scribe software for clinical note transcription with the patient, who gave verbal consent to proceed.  History of Present Illness   Jessica Graham is a 38 year old female with a history of allergies who presents with a persistent cough.  She has been experiencing a persistent cough for a significant period, initially attributing it to sinus drainage. The cough has since evolved to produce phlegm and is notably worse at night, particularly when lying down. She has been waking up throughout the night and coughing for the first four hours of the day. Despite sleeping in an upright position on the couch for the past two nights, the severity of the cough has not improved.  She has a history of allergies and is currently taking Singulair  for management. She previously used Symbicort  but discontinued it due to lack of efficacy. Her allergies are general, without specific allergens identified.  No scratchy or itchy throat. Eyes have not been itchy recently but have been swollen and crusty in the spring. Reports a nasally feeling of congestion and significant postnasal drip.         OBJECTIVE:       05/30/2024   10:54 AM  Depression screen PHQ 2/9  Decreased Interest 0  Down, Depressed, Hopeless 0  PHQ - 2 Score 0     BP Readings from Last 3 Encounters:  08/16/24 124/80  08/02/24 122/82  05/30/24 132/82    BP 124/80   Pulse 100   Wt 155 lb 12.8 oz (70.7 kg)   SpO2 100%   BMI 25.15 kg/m    Physical Exam    GENERAL: No acute distress      Physical Exam Constitutional:      Appearance: Normal appearance.  HENT:     Nose: Congestion and rhinorrhea present.     Mouth/Throat:     Pharynx: Posterior oropharyngeal erythema present.  Neurological:     General: No focal deficit present.     Mental Status: She is alert and oriented to person, place, and time. Mental status is at baseline.     ASSESSMENT/PLAN:   Assessment & Plan Post-nasal drip  Non-seasonal allergic rhinitis, unspecified trigger    Assessment and Plan    Postnasal drip and allergic rhinitis Chronic postnasal drip and allergic rhinitis with persistent cough, worse at night. Current treatment with Singulair  is insufficient. Differential includes sinus drainage causing throat irritation and cough. No pneumonia signs. Anticipated improvement of 20-40% with treatment. - Prescribed Astelin nasal spray, one spray each nostril twice daily. - Prescribed Flonase nasal spray, one spray each nostril twice daily. - Recommended daily Claritin or Zyrtec. - Advised use of a humidifier. - Suggested Mucinex as needed. - Encouraged consumption of spicy food. - Instructed follow-up in 2-3 weeks if no improvement.         Malee Grays A. Vita MD [facility]

## 2024-08-31 ENCOUNTER — Ambulatory Visit (HOSPITAL_BASED_OUTPATIENT_CLINIC_OR_DEPARTMENT_OTHER)

## 2024-08-31 ENCOUNTER — Encounter (HOSPITAL_BASED_OUTPATIENT_CLINIC_OR_DEPARTMENT_OTHER): Payer: Self-pay | Admitting: Pulmonary Disease

## 2024-08-31 ENCOUNTER — Other Ambulatory Visit (HOSPITAL_BASED_OUTPATIENT_CLINIC_OR_DEPARTMENT_OTHER): Payer: Self-pay

## 2024-08-31 ENCOUNTER — Ambulatory Visit (HOSPITAL_BASED_OUTPATIENT_CLINIC_OR_DEPARTMENT_OTHER): Admitting: Pulmonary Disease

## 2024-08-31 ENCOUNTER — Other Ambulatory Visit: Payer: Self-pay

## 2024-08-31 VITALS — BP 122/80 | HR 112 | Ht 66.0 in | Wt 155.3 lb

## 2024-08-31 DIAGNOSIS — R0602 Shortness of breath: Secondary | ICD-10-CM

## 2024-08-31 DIAGNOSIS — J45991 Cough variant asthma: Secondary | ICD-10-CM

## 2024-08-31 DIAGNOSIS — R0981 Nasal congestion: Secondary | ICD-10-CM

## 2024-08-31 MED ORDER — BUDESONIDE-FORMOTEROL FUMARATE 160-4.5 MCG/ACT IN AERO
2.0000 | INHALATION_SPRAY | Freq: Two times a day (BID) | RESPIRATORY_TRACT | 2 refills | Status: AC
  Filled 2024-08-31: qty 10.2, 30d supply, fill #0
  Filled 2024-09-25: qty 10.2, 30d supply, fill #1

## 2024-08-31 MED ORDER — MONTELUKAST SODIUM 10 MG PO TABS
10.0000 mg | ORAL_TABLET | Freq: Every day | ORAL | 3 refills | Status: AC
  Filled 2024-08-31: qty 90, 90d supply, fill #0

## 2024-08-31 NOTE — Patient Instructions (Addendum)
 Cough variant asthma --INCREASE Symbicort  160-4.5 mcg ONE puff in the morning and evening. Rinse mouth after use             >Increase to TWO puffs if symptoms persistent             >OK to trial off later this summer and restart when symptoms recur --CONTINUE Albuterol  AS NEEDED --CONTINUE Singulair  10 mg daily --CXR ordered. Will call results   Nasal congestion --CONTINUE flonase  1 spray per nostril twice a day --CONTINUE astelin  2 sprays once a day  Shortness of breath Likely due to acute illness, mild deconditioning --Encourage 20 min walking daily --Will re-evaluate at next visit

## 2024-08-31 NOTE — Progress Notes (Signed)
 Subjective:   PATIENT ID: Jessica Graham GENDER: female DOB: 04-Nov-1985, MRN: [id number]  Chief Complaint  Patient presents with   Cough    Still coughing has improved some    Reason for Visit: Follow-up      Jessica Graham is a 38 y.o. female never smoker with ADHD, depression who presents for follow-up for chronic cough.   Initial consult She reports persistent prolonged cough after illnesses. Sometimes associated wheezing at night.  She reports seeing her PCP in April 2024 and treated with cough syrup. Sometimes has nasal congestion. This seems to occur annually and will last for several months. Late fall/early winter seems to when her daughter starts school. Unsure if she had this before her daughter who is 80 years old. In her 36s had possibly similar episodes but not this severe. Denies childhood history of asthma.   She is currently on the tail end of this episode of coughing with minimal sputum production. Has tried albuterol  inhaler and tessalon  perles in the past.      08/31/2024 Discussed the use of AI scribe software for clinical note transcription with the patient, who gave verbal consent to proceed.  History of Present Illness Jessica Graham is a 38 year old female with asthma who presents with a persistent cough and respiratory symptoms.  She has been experiencing a persistent cough for the past two months, initially severe enough to disrupt sleep. The cough is productive, with thick, opaque sputum. Although the cough has improved, it remains present throughout the day, with less severity at night.  She has been on Singulair , Claritin , and two nasal sprays, but ran out of Symbicort  a month ago and has not noticed a significant difference since stopping it. She was treated with two rounds of antibiotics, Augmentin  and azithromycin, and a course of prednisone , which she completed tapering off yesterday. Despite these treatments, she continues to experience symptoms, including  getting winded easily and persistent chest congestion. No fever or chills are present.  She has a history of asthma and allergies, with previous eosinophil counts being elevated. She is currently on Singulair  and uses Flonase  and Astelin  nasal sprays to manage her symptoms. She does not feel that post-nasal drip is triggering her cough.  Her symptoms have impacted her ability to be active, particularly with her five-year-old child, as she becomes winded easily. Despite this, she has been trying to maintain her activity levels, meeting her step goals most days.   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 No pertinent family history.   Social History   Occupational History   Not on file  Tobacco Use   Smoking status: Never   Smokeless tobacco: Never  Substance and Sexual Activity   Alcohol use: Yes   Drug use: Never   Sexual activity: Yes    Birth control/protection: Pill    Allergies[1]   Outpatient Medications Prior to Visit  Medication Sig Dispense Refill   amphetamine -dextroamphetamine  (ADDERALL) 20 MG tablet Take 1 tablet (20 mg  total) by mouth 2 (two) times daily. 60 tablet 0   [START ON 09/27/2024] amphetamine -dextroamphetamine  (ADDERALL) 20 MG tablet Take 1 tablet (20 mg total) by mouth 2 (two) times daily. 60 tablet 0   amphetamine -dextroamphetamine  (ADDERALL) 20 MG tablet Take 1 tablet (20 mg total) by mouth 2 (two) times daily. 60 tablet 0   azelastine  (ASTELIN ) 0.1 % nasal spray Place 1 spray into both nostrils 2 (two) times  daily. Use in each nostril as directed 30 mL 12   buPROPion  (WELLBUTRIN  XL) 150 MG 24 hr tablet Take 2 tablets (300 mg total) by mouth daily. 180 tablet 1   cholecalciferol (VITAMIN D3) 25 MCG (1000 UNIT) tablet Take 1,000 Units by mouth daily. Pt. Takes 2,000 units dialy     fluticasone  (FLONASE ) 50 MCG/ACT nasal spray Place 2 sprays into both nostrils daily. 16 g 6   lisdexamfetamine  (VYVANSE ) 70 MG capsule Take 1 capsule (70 mg total) by mouth daily. 30 capsule 0   [START ON 09/27/2024] lisdexamfetamine  (VYVANSE ) 70 MG capsule Take 1 capsule (70 mg total) by mouth daily. 30 capsule 0   loratadine  (CLARITIN ) 10 MG tablet Take 1 tablet (10 mg total) by mouth daily. 30 tablet 11   omeprazole (PRILOSEC) 20 MG capsule Take 20 mg by mouth daily.     ondansetron  (ZOFRAN ) 8 MG tablet Take 1 tablet (8 mg total) by mouth every 8 (eight) hours as needed for nausea or vomiting. 30 tablet 5   montelukast  (SINGULAIR ) 10 MG tablet Take 10 mg by mouth at bedtime.     fluconazole  (DIFLUCAN ) 150 MG tablet Take 1 tablet at the first sign of symptoms. Repeat in 3 days if symptoms are still present. (Patient not taking: Reported on 08/31/2024) 2 tablet 3   lisdexamfetamine  (VYVANSE ) 70 MG capsule Take 1 capsule (70 mg total) by mouth daily. 30 capsule 0   norethindrone  (MICRONOR ) 0.35 MG tablet Take 1 tablet (0.35 mg total) by mouth every morning. 84 tablet 3   promethazine -dextromethorphan (PROMETHAZINE -DM) 6.25-15 MG/5ML syrup Take 5 mLs by mouth 4 (four) times daily as needed for cough. (Patient not taking: Reported on 08/31/2024) 118 mL 0   amoxicillin -clavulanate (AUGMENTIN ) 875-125 MG tablet Take 1 tablet by mouth 2 (two) times daily. (Patient not taking: Reported on 08/16/2024) 14 tablet 0   azithromycin (ZITHROMAX) 250 MG tablet Take 250 mg by mouth daily. (Patient not taking: Reported on 08/31/2024)     benzonatate  (TESSALON ) 200 MG capsule Take 1 capsule (200 mg total) by mouth 2 (two) times daily as needed  for cough. (Patient not taking: Reported on 08/31/2024) 20 capsule 0   budesonide -formoterol  (SYMBICORT ) 80-4.5 MCG/ACT inhaler INHALE 2 PUFFS INTO THE LUNGS TWICE A DAY (Patient not taking: Reported on 08/31/2024) 10.2 each 1   predniSONE  (DELTASONE ) 20 MG tablet Take 2 tablets (40 mg total) by mouth daily with breakfast. (Patient not taking: Reported on 08/31/2024) 10 tablet 0   No facility-administered medications prior to visit.    Review of Systems  Constitutional:  Negative for chills, diaphoresis, fever, malaise/fatigue and weight loss.  HENT:  Negative for congestion.   Respiratory:  Positive for cough, sputum production and shortness of breath. Negative for hemoptysis and wheezing.   Cardiovascular:  Negative for chest pain, palpitations and leg swelling.     Objective:   Vitals:   08/31/24 0845  BP: 122/80  Pulse: (!) 112  SpO2: 100%  Weight: 155 lb 4.8 oz (70.4 kg)  Height: 5' 6 (1.676 m)  SpO2: 100 %  Physical Exam GENERAL: Well appearing, no acute distress. HEAD EARS NOSE THROAT: Normocephalic, atraumatic. EYES: Extraocular movements intact, no scleral icterus. RESPIRATORY: Coarse breath sounds on the right side, no crackles, wheezing or rales. CARDIOVASCULAR: Regular rate and rhythm, no murmurs, rubs, or gallops, no jugular venous distention. EXTREMITIES: No edema, no tenderness. NEUROLOGICAL: Alert and oriented x4, cranial nerves II-XII grossly intact. PSYCHIATRIC: Normal mood, normal affect.   Data Reviewed:  Imaging: CXR 08/04/24 - No infiltrate effusion or edema  PFT: 03/12/23 FVC 4.28 (106%) FEV1 3.45 (103%) Ratio 77  TLC 106% DLCO 106%. Partial bronchodilator response however does not preclude benefit of inhaler treatment Interpretation: Normal PFTs  Labs: CBC    Component Value Date/Time   WBC 8.8 08/02/2024 1051   WBC 15.8 (H) 10/19/2020 0957   RBC 3.90 08/02/2024 1051   RBC 4.29 10/19/2020 0957   HGB 11.9 08/02/2024 1051   HCT 37.1  08/02/2024 1051   PLT 424 08/02/2024 1051   MCV 95 08/02/2024 1051   MCH 30.5 08/02/2024 1051   MCH 30.3 10/19/2020 0957   MCHC 32.1 08/02/2024 1051   MCHC 32.4 10/19/2020 0957   RDW 12.1 08/02/2024 1051   LYMPHSABS 2.6 08/02/2024 1051   EOSABS 0.1 08/02/2024 1051   BASOSABS 0.0 08/02/2024 1051    Absolute eos  12/15/22 - 300  07/16/24 - 100    Assessment & Plan:   Discussion: 38 year old female never smoker with ADHD, depression who presents for recurrent chronic cough for the last two months. Cough is improving after course of Augment and azithromycin. For cough variant asthma or asthmatic bronchitis, recommend continuing inhalers during her peak seasons. Her dyspnea is her primary concern as it remains persistent. We discussed mild deconditioning related to recent illness. Will obtain CXR to rule out post-viral complications though suspicion is low. Counseled on medications below.    Assessment & Plan Cough variant asthma --INCREASE Symbicort  160-4.5 mcg ONE puff in the morning and evening. Rinse mouth after use             >Increase to TWO puffs if symptoms persistent             >OK to trial off later this summer and restart when symptoms recur --CONTINUE Albuterol  AS NEEDED --CONTINUE Singulair  10 mg daily --CXR ordered. Will call with results Nasal congestion --CONTINUE flonase  1 spray per nostril twice a day --CONTINUE astelin  2 sprays once a day Shortness of breath New. Improved but persistent. Likely due to acute illness, mild deconditioning --Encourage 20 min walking daily --Will re-evaluate at next visit  Health Maintenance Immunization History  Administered Date(s) Administered   Fluzone Influenza virus vaccine,trivalent (IIV3), split virus 07/11/2015   HPV 9-valent 07/27/2019, 10/30/2021, 04/29/2022   Influenza, Seasonal, Injecte, Preservative Fre 08/04/2023, 05/30/2024   Influenza,inj,Quad PF,6+ Mos 06/11/2017, 12/07/2018, 05/27/2019   Influenza,inj,quad,  With Preservative 05/16/2020   Influenza-Unspecified 06/15/2021   PFIZER Comirnaty ETTERGray Top)Covid-19 Tri-Sucrose Vaccine 11/11/2019, 12/02/2019   PFIZER(Purple Top)SARS-COV-2 Vaccination 08/22/2020   Pfizer Covid-19 Vaccine Bivalent Booster 77yrs & up 08/13/2021   Pfizer(Comirnaty )Fall Seasonal Vaccine 12 years and older 08/04/2023   Tdap 04/20/2015, 07/11/2015, 04/27/2019   CT Lung Screen - never smoker. Not qualified  Orders Placed This Encounter  Procedures   DG Chest 2 View    Standing Status:   Future    Number of Occurrences:   1    Expiration Date:   08/31/2025    Reason for Exam (SYMPTOM  OR DIAGNOSIS  REQUIRED):   cough    Is patient pregnant?:   No    Preferred imaging location?:   [facility]   Meds ordered this encounter  Medications   budesonide -formoterol  (SYMBICORT ) 160-4.5 MCG/ACT inhaler    Sig: Inhale 2 puffs into the lungs 2 (two) times daily.    Dispense:  10.2 g    Refill:  2   montelukast  (SINGULAIR ) 10 MG tablet    Sig: Take 1 tablet (10 mg total) by mouth at bedtime.    Dispense:  90 tablet    Refill:  3    Return in about 3 months (around 11/29/2024).  Athony Coppa Slater Staff, MD [facility] 08/31/2024 9:27 AM        [1] No Known Allergies

## 2024-09-07 ENCOUNTER — Other Ambulatory Visit: Payer: Self-pay | Admitting: Nurse Practitioner

## 2024-09-07 ENCOUNTER — Other Ambulatory Visit (HOSPITAL_BASED_OUTPATIENT_CLINIC_OR_DEPARTMENT_OTHER): Payer: Self-pay

## 2024-09-07 DIAGNOSIS — F900 Attention-deficit hyperactivity disorder, predominantly inattentive type: Secondary | ICD-10-CM

## 2024-09-07 MED ORDER — AMPHETAMINE-DEXTROAMPHETAMINE 20 MG PO TABS: 20.0000 mg | ORAL_TABLET | Freq: Two times a day (BID) | ORAL | 0 refills | Status: DC

## 2024-09-07 MED ORDER — LISDEXAMFETAMINE DIMESYLATE 70 MG PO CAPS: 70.0000 mg | ORAL_CAPSULE | Freq: Every day | ORAL | 0 refills | Status: DC

## 2024-09-07 NOTE — Telephone Encounter (Signed)
 Last appt 08/02/24.

## 2024-09-09 ENCOUNTER — Ambulatory Visit (HOSPITAL_BASED_OUTPATIENT_CLINIC_OR_DEPARTMENT_OTHER): Payer: Self-pay | Admitting: Pulmonary Disease

## 2024-09-10 ENCOUNTER — Other Ambulatory Visit (HOSPITAL_BASED_OUTPATIENT_CLINIC_OR_DEPARTMENT_OTHER): Payer: Self-pay

## 2024-09-10 ENCOUNTER — Encounter: Payer: Self-pay | Admitting: Nurse Practitioner

## 2024-09-10 DIAGNOSIS — F900 Attention-deficit hyperactivity disorder, predominantly inattentive type: Secondary | ICD-10-CM

## 2024-09-12 ENCOUNTER — Other Ambulatory Visit (HOSPITAL_BASED_OUTPATIENT_CLINIC_OR_DEPARTMENT_OTHER): Payer: Self-pay

## 2024-09-12 MED ORDER — LISDEXAMFETAMINE DIMESYLATE 70 MG PO CAPS
70.0000 mg | ORAL_CAPSULE | Freq: Every day | ORAL | 0 refills | Status: AC
  Filled 2024-10-12 (×2): qty 30, 30d supply, fill #0

## 2024-09-12 MED ORDER — LISDEXAMFETAMINE DIMESYLATE 70 MG PO CAPS: 70.0000 mg | ORAL_CAPSULE | Freq: Every day | ORAL | 0 refills | Status: AC

## 2024-09-12 MED ORDER — AMPHETAMINE-DEXTROAMPHETAMINE 20 MG PO TABS: 20.0000 mg | ORAL_TABLET | Freq: Two times a day (BID) | ORAL | 0 refills | Status: DC

## 2024-09-12 MED ORDER — LISDEXAMFETAMINE DIMESYLATE 70 MG PO CAPS
70.0000 mg | ORAL_CAPSULE | Freq: Every day | ORAL | 0 refills | Status: AC
  Filled 2024-09-12: qty 30, 30d supply, fill #0

## 2024-09-12 MED ORDER — AMPHETAMINE-DEXTROAMPHETAMINE 20 MG PO TABS
20.0000 mg | ORAL_TABLET | Freq: Two times a day (BID) | ORAL | 0 refills | Status: DC
  Filled 2024-09-12: qty 60, 30d supply, fill #0

## 2024-09-16 ENCOUNTER — Ambulatory Visit (HOSPITAL_BASED_OUTPATIENT_CLINIC_OR_DEPARTMENT_OTHER): Admitting: Pulmonary Disease

## 2024-09-26 ENCOUNTER — Other Ambulatory Visit (HOSPITAL_BASED_OUTPATIENT_CLINIC_OR_DEPARTMENT_OTHER): Payer: Self-pay

## 2024-10-12 ENCOUNTER — Other Ambulatory Visit (HOSPITAL_BASED_OUTPATIENT_CLINIC_OR_DEPARTMENT_OTHER): Payer: Self-pay

## 2024-10-12 ENCOUNTER — Other Ambulatory Visit: Payer: Self-pay | Admitting: Nurse Practitioner

## 2024-10-12 ENCOUNTER — Other Ambulatory Visit: Payer: Self-pay

## 2024-10-12 DIAGNOSIS — F900 Attention-deficit hyperactivity disorder, predominantly inattentive type: Secondary | ICD-10-CM

## 2024-10-12 MED ORDER — AMPHETAMINE-DEXTROAMPHETAMINE 20 MG PO TABS
20.0000 mg | ORAL_TABLET | Freq: Two times a day (BID) | ORAL | 0 refills | Status: AC
  Filled 2024-10-12: qty 60, 30d supply, fill #0

## 2024-10-12 MED ORDER — AMPHETAMINE-DEXTROAMPHETAMINE 20 MG PO TABS: 20.0000 mg | ORAL_TABLET | Freq: Two times a day (BID) | ORAL | 0 refills | Status: AC

## 2024-10-12 NOTE — Telephone Encounter (Signed)
 Last appt 05/30/24

## 2024-10-19 ENCOUNTER — Other Ambulatory Visit (HOSPITAL_BASED_OUTPATIENT_CLINIC_OR_DEPARTMENT_OTHER): Payer: Self-pay

## 2024-10-19 ENCOUNTER — Other Ambulatory Visit: Payer: Self-pay

## 2024-10-19 ENCOUNTER — Encounter: Payer: Self-pay | Admitting: Nurse Practitioner

## 2024-10-19 DIAGNOSIS — Z3041 Encounter for surveillance of contraceptive pills: Secondary | ICD-10-CM

## 2024-10-19 MED ORDER — NORETHINDRONE 0.35 MG PO TABS
1.0000 | ORAL_TABLET | Freq: Every morning | ORAL | 1 refills | Status: AC
  Filled 2024-10-19: qty 84, 84d supply, fill #0

## 2024-11-01 ENCOUNTER — Ambulatory Visit: Admitting: Neurology

## 2025-02-02 ENCOUNTER — Encounter: Admitting: Nurse Practitioner
